# Patient Record
Sex: Female | Born: 1985 | Race: Black or African American | Hispanic: No | Marital: Single | Smoking: Current every day smoker
Health system: Southern US, Community
[De-identification: ages and names within clinical notes are randomized; demographics above are authoritative.]

## PROBLEM LIST (undated history)

## (undated) ENCOUNTER — Inpatient Hospital Stay (HOSPITAL_COMMUNITY): Payer: Self-pay

## (undated) DIAGNOSIS — D573 Sickle-cell trait: Secondary | ICD-10-CM

## (undated) DIAGNOSIS — D352 Benign neoplasm of pituitary gland: Secondary | ICD-10-CM

## (undated) HISTORY — DX: Sickle-cell trait: D57.3

---

## 2013-11-12 ENCOUNTER — Encounter (HOSPITAL_COMMUNITY): Payer: Self-pay | Admitting: Emergency Medicine

## 2013-11-12 ENCOUNTER — Emergency Department (HOSPITAL_COMMUNITY)
Admission: EM | Admit: 2013-11-12 | Discharge: 2013-11-12 | Disposition: A | Discharge status: Home / Self Care | Payer: Medicaid Other | Attending: Emergency Medicine | Admitting: Emergency Medicine

## 2013-11-12 DIAGNOSIS — A499 Bacterial infection, unspecified: Secondary | ICD-10-CM | POA: Insufficient documentation

## 2013-11-12 DIAGNOSIS — Z8639 Personal history of other endocrine, nutritional and metabolic disease: Secondary | ICD-10-CM | POA: Insufficient documentation

## 2013-11-12 DIAGNOSIS — K5900 Constipation, unspecified: Secondary | ICD-10-CM | POA: Diagnosis not present

## 2013-11-12 DIAGNOSIS — O9989 Other specified diseases and conditions complicating pregnancy, childbirth and the puerperium: Secondary | ICD-10-CM | POA: Diagnosis not present

## 2013-11-12 DIAGNOSIS — O99330 Smoking (tobacco) complicating pregnancy, unspecified trimester: Secondary | ICD-10-CM | POA: Insufficient documentation

## 2013-11-12 DIAGNOSIS — B9689 Other specified bacterial agents as the cause of diseases classified elsewhere: Secondary | ICD-10-CM | POA: Diagnosis not present

## 2013-11-12 DIAGNOSIS — Z862 Personal history of diseases of the blood and blood-forming organs and certain disorders involving the immune mechanism: Secondary | ICD-10-CM | POA: Diagnosis not present

## 2013-11-12 DIAGNOSIS — N760 Acute vaginitis: Secondary | ICD-10-CM | POA: Insufficient documentation

## 2013-11-12 DIAGNOSIS — R350 Frequency of micturition: Secondary | ICD-10-CM | POA: Diagnosis not present

## 2013-11-12 DIAGNOSIS — O2390 Unspecified genitourinary tract infection in pregnancy, unspecified trimester: Secondary | ICD-10-CM | POA: Diagnosis present

## 2013-11-12 DIAGNOSIS — Z3490 Encounter for supervision of normal pregnancy, unspecified, unspecified trimester: Secondary | ICD-10-CM

## 2013-11-12 HISTORY — DX: Benign neoplasm of pituitary gland: D35.2

## 2013-11-12 LAB — URINALYSIS, ROUTINE W REFLEX MICROSCOPIC
Bilirubin Urine: NEGATIVE
GLUCOSE, UA: NEGATIVE mg/dL
Hgb urine dipstick: NEGATIVE
KETONES UR: NEGATIVE mg/dL
Leukocytes, UA: NEGATIVE
NITRITE: NEGATIVE
PH: 5.5 (ref 5.0–8.0)
Protein, ur: NEGATIVE mg/dL
Specific Gravity, Urine: 1.013 (ref 1.005–1.030)
Urobilinogen, UA: 0.2 mg/dL (ref 0.0–1.0)

## 2013-11-12 LAB — WET PREP, GENITAL
TRICH WET PREP: NONE SEEN
WBC, Wet Prep HPF POC: NONE SEEN
YEAST WET PREP: NONE SEEN

## 2013-11-12 LAB — POC URINE PREG, ED: PREG TEST UR: POSITIVE — AB

## 2013-11-12 MED ORDER — METRONIDAZOLE 500 MG PO TABS: 500.0000 mg | ORAL_TABLET | Freq: Two times a day (BID) | ORAL | Status: DC

## 2013-11-12 MED ORDER — METRONIDAZOLE 500 MG PO TABS
500.0000 mg | ORAL_TABLET | Freq: Once | ORAL | Status: AC
  Administered 2013-11-12: 500 mg via ORAL
  Filled 2013-11-12: qty 1

## 2013-11-12 NOTE — Discharge Instructions (Signed)
Call the referral number to set an appointment Tell them your are referred through the ED and are high risk

## 2013-11-12 NOTE — ED Notes (Signed)
Pelvic cart set up at bedside  

## 2013-11-12 NOTE — ED Notes (Addendum)
Presents with urinary frequency for one month, dneis pain with urination, reports "it feels like I can't fully empty my bladder" also c/o white discharge when bearing down to use the bathroom. LMP: June 1st-I am late on my period. Last BM-11/07/13. Reports bloating and abdominal cramps.

## 2013-11-12 NOTE — ED Provider Notes (Signed)
CSN: [id number]     Arrival date & time 11/12/13  1858 History   First MD Initiated Contact with Patient 11/12/13 2110     Chief Complaint  Patient presents with  . Urinary Frequency  . Constipation     (Consider location/radiation/quality/duration/timing/severity/associated sxs/prior Treatment) HPI Comments: Patient with a history of urinary frequency for the past, month, and chronic constipation.  States she has not had a good bowel movement in the past 9 days.  When she forces herself to have a bowel movement.  A small amount and she desire feel as, though she is relieved herself completely.  Also, reports a last menstrual period was June 1 2 has not done.  Pregnancy test, as she has not gotten pregnant in the past 6 years.  Despite the fact of no birth control.  She has a history of, to deteriorate, adenoma.  He has no history of STDs or GYN issues. Denies any vaginal discharge, but would like a gynecological test to make, sure she does not have BV or yeast infection. She moved to the area, approximately one week ago.  Does not have a local OB/GYN  Patient is a 28 y.o. female presenting with frequency and constipation. The history is provided by the patient.  Urinary Frequency This is a new problem. The current episode started 1 to 4 weeks ago. The problem occurs intermittently. The problem has been unchanged. Pertinent negatives include no abdominal pain, chest pain, diaphoresis, headaches, nausea, rash or vomiting. Nothing aggravates the symptoms. She has tried nothing for the symptoms. The treatment provided no relief.  Constipation Associated symptoms: no abdominal pain, no diarrhea, no dysuria, no nausea and no vomiting     Past Medical History  Diagnosis Date  . Pituitary adenoma    Past Surgical History  Procedure Laterality Date  . Cesarean section     History reviewed. No pertinent family history. History  Substance Use Topics  . Smoking status: Current Every Day Smoker     Types: Cigarettes  . Smokeless tobacco: Not on file  . Alcohol Use: No   OB History   Grav Para Term Preterm Abortions TAB SAB Ect Mult Living                 Review of Systems  Constitutional: Negative for diaphoresis.  Cardiovascular: Negative for chest pain.  Gastrointestinal: Positive for constipation and abdominal distention. Negative for nausea, vomiting, abdominal pain and diarrhea.  Genitourinary: Positive for frequency and vaginal discharge. Negative for dysuria, urgency, decreased urine volume, vaginal bleeding and vaginal pain.  Skin: Negative for rash.  Neurological: Negative for dizziness and headaches.  All other systems reviewed and are negative.     Allergies  Vicodin  Home Medications   Prior to Admission medications   Medication Sig Start Date End Date Taking? Authorizing Provider  metroNIDAZOLE (FLAGYL) 500 MG tablet Take 1 tablet (500 mg total) by mouth 2 (two) times daily. 11/12/13   Garald Balding, NP   BP 103/70  Pulse 63  Temp(Src) 98.7 F (37.1 C) (Oral)  Resp 20  Ht 5\' 2"  (1.575 m)  Wt 165 lb (74.844 kg)  BMI 30.17 kg/m2  SpO2 98%  LMP 09/30/2013 Physical Exam  Nursing note and vitals reviewed. Constitutional: She appears well-developed and well-nourished.  HENT:  Head: Normocephalic.  Eyes: Pupils are equal, round, and reactive to light.  Neck: Normal range of motion.  Pulmonary/Chest: Effort normal.  Genitourinary: Uterus normal. Cervix exhibits no discharge. Vaginal discharge found.  Musculoskeletal: Normal range of motion.  Neurological: She is alert.  Skin: Skin is warm and dry.    ED Course  Procedures (including critical care time) Labs Review Labs Reviewed  WET PREP, GENITAL - Abnormal; Notable for the following:    Clue Cells Wet Prep HPF POC MANY (*)    All other components within normal limits  POC URINE PREG, ED - Abnormal; Notable for the following:    Preg Test, Ur POSITIVE (*)    All other components within  normal limits  GC/CHLAMYDIA PROBE AMP  URINALYSIS, ROUTINE W REFLEX MICROSCOPIC    Imaging Review No results found.   EKG Interpretation None      MDM  Is positive for BV should be started on metronidazole 500 mg twice a day for 7 days.  She's also been referred to [facility] hospital.  She is to call first in the morning telling them that she is high-risk and referred to the emergency department Final diagnoses:  Pregnancy  BV (bacterial vaginosis)        Garald Balding, NP 11/12/13 2242

## 2013-11-13 LAB — GC/CHLAMYDIA PROBE AMP
CT Probe RNA: NEGATIVE
GC PROBE AMP APTIMA: NEGATIVE

## 2013-11-13 NOTE — ED Provider Notes (Signed)
Medical screening examination/treatment/procedure(s) were performed by non-physician practitioner and as supervising physician I was immediately available for consultation/collaboration.   EKG Interpretation None       Varney Biles, MD 11/13/13 1210

## 2013-11-27 ENCOUNTER — Encounter (HOSPITAL_COMMUNITY): Payer: Self-pay | Admitting: *Deleted

## 2013-11-27 ENCOUNTER — Inpatient Hospital Stay (HOSPITAL_COMMUNITY): Payer: Medicaid Other

## 2013-11-27 ENCOUNTER — Inpatient Hospital Stay (HOSPITAL_COMMUNITY)
Admission: AD | Admit: 2013-11-27 | Discharge: 2013-11-27 | Disposition: A | Discharge status: Home / Self Care | Payer: Medicaid Other | Source: Ambulatory Visit | Attending: Obstetrics & Gynecology | Admitting: Obstetrics & Gynecology

## 2013-11-27 DIAGNOSIS — O99891 Other specified diseases and conditions complicating pregnancy: Secondary | ICD-10-CM | POA: Diagnosis not present

## 2013-11-27 DIAGNOSIS — O99330 Smoking (tobacco) complicating pregnancy, unspecified trimester: Secondary | ICD-10-CM | POA: Diagnosis not present

## 2013-11-27 DIAGNOSIS — R109 Unspecified abdominal pain: Secondary | ICD-10-CM | POA: Insufficient documentation

## 2013-11-27 DIAGNOSIS — O9989 Other specified diseases and conditions complicating pregnancy, childbirth and the puerperium: Secondary | ICD-10-CM

## 2013-11-27 DIAGNOSIS — O26899 Other specified pregnancy related conditions, unspecified trimester: Secondary | ICD-10-CM

## 2013-11-27 LAB — URINALYSIS, ROUTINE W REFLEX MICROSCOPIC
Bilirubin Urine: NEGATIVE
Glucose, UA: NEGATIVE mg/dL
HGB URINE DIPSTICK: NEGATIVE
KETONES UR: NEGATIVE mg/dL
Leukocytes, UA: NEGATIVE
NITRITE: NEGATIVE
PH: 7.5 (ref 5.0–8.0)
Protein, ur: NEGATIVE mg/dL
SPECIFIC GRAVITY, URINE: 1.01 (ref 1.005–1.030)
Urobilinogen, UA: 0.2 mg/dL (ref 0.0–1.0)

## 2013-11-27 LAB — CBC WITH DIFFERENTIAL/PLATELET
Basophils Absolute: 0 10*3/uL (ref 0.0–0.1)
Basophils Relative: 0 % (ref 0–1)
Eosinophils Absolute: 0.2 10*3/uL (ref 0.0–0.7)
Eosinophils Relative: 3 % (ref 0–5)
HEMATOCRIT: 39.1 % (ref 36.0–46.0)
HEMOGLOBIN: 13.6 g/dL (ref 12.0–15.0)
LYMPHS ABS: 1.9 10*3/uL (ref 0.7–4.0)
Lymphocytes Relative: 28 % (ref 12–46)
MCH: 30.3 pg (ref 26.0–34.0)
MCHC: 34.8 g/dL (ref 30.0–36.0)
MCV: 87.1 fL (ref 78.0–100.0)
MONO ABS: 0.8 10*3/uL (ref 0.1–1.0)
Monocytes Relative: 12 % (ref 3–12)
NEUTROS PCT: 57 % (ref 43–77)
Neutro Abs: 3.9 10*3/uL (ref 1.7–7.7)
Platelets: 188 10*3/uL (ref 150–400)
RBC: 4.49 MIL/uL (ref 3.87–5.11)
RDW: 13 % (ref 11.5–15.5)
WBC: 6.8 10*3/uL (ref 4.0–10.5)

## 2013-11-27 LAB — HCG, QUANTITATIVE, PREGNANCY: hCG, Beta Chain, Quant, S: 83474 m[IU]/mL — ABNORMAL HIGH (ref <5)

## 2013-11-27 NOTE — MAU Note (Signed)
Cramping and pulling pain, bilateral- started last wk. Hx of incompetent cervix, stillborn at 68wks.

## 2013-11-27 NOTE — Discharge Instructions (Signed)
Abdominal Pain During Pregnancy °Belly (abdominal) pain is common during pregnancy. Most of the time, it is not a serious problem. Other times, it can be a sign that something is wrong with the pregnancy. Always tell your doctor if you have belly pain. °HOME CARE °Monitor your belly pain for any changes. The following actions may help you feel better: °· Do not have sex (intercourse) or put anything in your vagina until you feel better. °· Rest until your pain stops. °· Drink clear fluids if you feel sick to your stomach (nauseous). Do not eat solid food until you feel better. °· Only take medicine as told by your doctor. °· Keep all doctor visits as told. °GET HELP RIGHT AWAY IF:  °· You are bleeding, leaking fluid, or pieces of tissue come out of your vagina. °· You have more pain or cramping. °· You keep throwing up (vomiting). °· You have pain when you pee (urinate) or have blood in your pee. °· You have a fever. °· You do not feel your baby moving as much. °· You feel very weak or feel like passing out. °· You have trouble breathing, with or without belly pain. °· You have a very bad headache and belly pain. °· You have fluid leaking from your vagina and belly pain. °· You keep having watery poop (diarrhea). °· Your belly pain does not go away after resting, or the pain gets worse. °MAKE SURE YOU:  °· Understand these instructions. °· Will watch your condition. °· Will get help right away if you are not doing well or get worse. °Document Released: 04/06/2009 Document Revised: 12/19/2012 Document Reviewed: 11/15/2012 °[employer]® Patient Information ©2015 [employer]. This information is not intended to replace advice given to you by your health care provider. Make sure you discuss any questions you have with your health care provider. ° °

## 2013-11-27 NOTE — MAU Provider Note (Signed)
History     CSN: [id number]  Arrival date and time: 11/27/13 1432   First Provider Initiated Contact with Patient 11/27/13 1521      Chief Complaint  Patient presents with  . Abdominal Pain   HPI Evelyn Thornton is a 28 y.o. [phone] at [redacted]w[redacted]d who presents to MAU today with complaint of abdominal pain x 1 week. The patient states that the pain is in the lower abdomen bilaterally. She feels it is getting worse over the last few days. She describes the pain as pulling. She denies vaginal bleeding, discharge, fever, N/V or UTI symptoms. She has an appointment to start prenatal care with [facility] on 12/09/13.   OB History   Grav Para Term Preterm Abortions TAB SAB Ect Mult Living   4 2 2  1  1   2       Past Medical History  Diagnosis Date  . Pituitary adenoma     Past Surgical History  Procedure Laterality Date  . Cesarean section      Family History  Problem Relation Age of Onset  . Hypertension Mother   . Hypertension Maternal Grandmother   . Hypertension Paternal Grandmother   . Diabetes Maternal Grandfather     History  Substance Use Topics  . Smoking status: Current Every Day Smoker    Types: Cigarettes  . Smokeless tobacco: Not on file  . Alcohol Use: No    Allergies:  Allergies  Allergen Reactions  . Vicodin [Hydrocodone-Acetaminophen] Anaphylaxis    No prescriptions prior to admission    Review of Systems  Constitutional: Negative for fever and malaise/fatigue.  Gastrointestinal: Positive for abdominal pain. Negative for nausea, vomiting, diarrhea and constipation.  Genitourinary: Negative for dysuria, urgency and frequency.       Neg - vaginal bleeding, discharge   Physical Exam   Blood pressure 101/57, pulse 78, temperature 98.5 F (36.9 C), temperature source Oral, resp. rate 16, height 5' 1.5" (1.562 m), weight 171 lb (77.565 kg), last menstrual period 09/30/2013.  Physical Exam  Constitutional: She is oriented to person, place, and time. She  appears well-developed and well-nourished. No distress.  HENT:  Head: Normocephalic.  Cardiovascular: Normal rate.   Respiratory: Effort normal.  GI: Soft. She exhibits no distension and no mass. There is tenderness (midline lower abdominal tenderness to palpation). There is no rebound and no guarding.  Neurological: She is alert and oriented to person, place, and time.  Skin: Skin is warm and dry. No erythema.  Psychiatric: She has a normal mood and affect.   Results for orders placed during the hospital encounter of 11/27/13 (from the past 24 hour(s))  URINALYSIS, ROUTINE W REFLEX MICROSCOPIC     Status: None   Collection Time    11/27/13  3:35 PM      Result Value Ref Range   Color, Urine YELLOW  YELLOW   APPearance CLEAR  CLEAR   Specific Gravity, Urine 1.010  1.005 - 1.030   pH 7.5  5.0 - 8.0   Glucose, UA NEGATIVE  NEGATIVE mg/dL   Hgb urine dipstick NEGATIVE  NEGATIVE   Bilirubin Urine NEGATIVE  NEGATIVE   Ketones, ur NEGATIVE  NEGATIVE mg/dL   Protein, ur NEGATIVE  NEGATIVE mg/dL   Urobilinogen, UA 0.2  0.0 - 1.0 mg/dL   Nitrite NEGATIVE  NEGATIVE   Leukocytes, UA NEGATIVE  NEGATIVE  CBC WITH DIFFERENTIAL     Status: None   Collection Time    11/27/13  3:37 PM  Result Value Ref Range   WBC 6.8  4.0 - 10.5 K/uL   RBC 4.49  3.87 - 5.11 MIL/uL   Hemoglobin 13.6  12.0 - 15.0 g/dL   HCT 39.1  36.0 - 46.0 %   MCV 87.1  78.0 - 100.0 fL   MCH 30.3  26.0 - 34.0 pg   MCHC 34.8  30.0 - 36.0 g/dL   RDW 13.0  11.5 - 15.5 %   Platelets 188  150 - 400 K/uL   Neutrophils Relative % 57  43 - 77 %   Neutro Abs 3.9  1.7 - 7.7 K/uL   Lymphocytes Relative 28  12 - 46 %   Lymphs Abs 1.9  0.7 - 4.0 K/uL   Monocytes Relative 12  3 - 12 %   Monocytes Absolute 0.8  0.1 - 1.0 K/uL   Eosinophils Relative 3  0 - 5 %   Eosinophils Absolute 0.2  0.0 - 0.7 K/uL   Basophils Relative 0  0 - 1 %   Basophils Absolute 0.0  0.0 - 0.1 K/uL  HCG, QUANTITATIVE, PREGNANCY     Status: Abnormal    Collection Time    11/27/13  3:37 PM      Result Value Ref Range   hCG, Beta Chain, Quant, S [id number] (*) <5 mIU/mL   US Ob Comp Less 14 Wks  11/27/2013   CLINICAL DATA:  Pelvic pain.  EXAM: OBSTETRIC <14 WK [place] AND TRANSVAGINAL OB US  TECHNIQUE: Both transabdominal and transvaginal ultrasound examinations were performed for complete evaluation of the gestation as well as the maternal uterus, adnexal regions, and pelvic cul-de-sac. Transvaginal technique was performed to assess early pregnancy.  COMPARISON:  None.  FINDINGS: Intrauterine gestational sac: Visualized/normal in shape.  Yolk sac:  Visualized.  Embryo:  Visualized.  Cardiac Activity: Visualized.  Heart Rate:  154 bpm  CRL:   14.8  mm   7 w 6 d                  [place] EDC: July 10, 2014.  Maternal uterus/adnexae: Probable corpus luteum cyst seen in right ovary. Left ovary appears normal. No hemorrhage is noted.  IMPRESSION: Single live intrauterine gestation of 7 weeks 6 days.   Electronically Signed   By: Sabino Dick M.D.   On: 11/27/2013 16:23   US Ob Transvaginal  11/27/2013   CLINICAL DATA:  Pelvic pain.  EXAM: OBSTETRIC <14 WK [place] AND TRANSVAGINAL OB US  TECHNIQUE: Both transabdominal and transvaginal ultrasound examinations were performed for complete evaluation of the gestation as well as the maternal uterus, adnexal regions, and pelvic cul-de-sac. Transvaginal technique was performed to assess early pregnancy.  COMPARISON:  None.  FINDINGS: Intrauterine gestational sac: Visualized/normal in shape.  Yolk sac:  Visualized.  Embryo:  Visualized.  Cardiac Activity: Visualized.  Heart Rate:  154 bpm  CRL:   14.8  mm   7 w 6 d                  [place] EDC: July 10, 2014.  Maternal uterus/adnexae: Probable corpus luteum cyst seen in right ovary. Left ovary appears normal. No hemorrhage is noted.  IMPRESSION: Single live intrauterine gestation of 7 weeks 6 days.   Electronically Signed   By: Sabino Dick M.D.   On: 11/27/2013 16:23    MAU Course   Procedures None  MDM +UPT @ [facility] a few weeks ago UA, CBC, ABO/Rh, quant hCG and [place] today Pelvic  exam deferred as patient was just recently examined and treated within the last 2 weeks  Assessment and Plan  A: [facility] at [redacted]w[redacted]d Abdominal pain in pregnancy  P: Discharge home Patient advised to use Tylenol PRN pain First trimester warning signs discussed Patient advised to keep scheduled appointment to start prenatal care with [facility] Patient may return to MAU as needed or if her condition were to change or worsen   Farris Has, PA-C  11/27/2013, 7:22 PM

## 2013-11-27 NOTE — MAU Note (Signed)
Was on fertility medicine on April and May (MD in [place])

## 2013-11-27 NOTE — MAU Note (Signed)
Pt states she found out she was pregnant about 10days ago.pt states she was told that she had" ovarian failure"  In May. Pt states she took fertility medication in April and May. Pt states she feels like her last period June 1,2015. Which was light and pt states she was have spotting.

## 2013-11-28 NOTE — MAU Provider Note (Signed)
Attestation of Attending Supervision of Advanced Practitioner (PA/CNM/NP): Evaluation and management procedures were performed by the Advanced Practitioner under my supervision and collaboration.  I have reviewed the Advanced Practitioner's note and chart, and I agree with the management and plan.  Katara Griner, MD, FACOG Attending Obstetrician & Gynecologist Faculty Practice, [facility]   

## 2013-12-09 ENCOUNTER — Encounter: Payer: Self-pay | Admitting: Family Medicine

## 2013-12-09 ENCOUNTER — Other Ambulatory Visit (HOSPITAL_COMMUNITY)
Admission: RE | Admit: 2013-12-09 | Discharge: 2013-12-09 | Disposition: A | Discharge status: Home / Self Care | Payer: Medicaid Other | Source: Ambulatory Visit | Attending: Family Medicine | Admitting: Family Medicine

## 2013-12-09 ENCOUNTER — Ambulatory Visit (INDEPENDENT_AMBULATORY_CARE_PROVIDER_SITE_OTHER): Payer: Medicaid Other | Admitting: Family Medicine

## 2013-12-09 VITALS — BP 112/74 | HR 91 | Temp 98.4°F | Ht 62.0 in | Wt 168.5 lb

## 2013-12-09 DIAGNOSIS — O0990 Supervision of high risk pregnancy, unspecified, unspecified trimester: Secondary | ICD-10-CM | POA: Insufficient documentation

## 2013-12-09 DIAGNOSIS — Z01419 Encounter for gynecological examination (general) (routine) without abnormal findings: Secondary | ICD-10-CM | POA: Insufficient documentation

## 2013-12-09 DIAGNOSIS — O09299 Supervision of pregnancy with other poor reproductive or obstetric history, unspecified trimester: Secondary | ICD-10-CM

## 2013-12-09 DIAGNOSIS — O3680X0 Pregnancy with inconclusive fetal viability, not applicable or unspecified: Secondary | ICD-10-CM

## 2013-12-09 DIAGNOSIS — Z3480 Encounter for supervision of other normal pregnancy, unspecified trimester: Secondary | ICD-10-CM

## 2013-12-09 DIAGNOSIS — O34219 Maternal care for unspecified type scar from previous cesarean delivery: Secondary | ICD-10-CM | POA: Insufficient documentation

## 2013-12-09 DIAGNOSIS — O99330 Smoking (tobacco) complicating pregnancy, unspecified trimester: Secondary | ICD-10-CM

## 2013-12-09 DIAGNOSIS — D353 Benign neoplasm of craniopharyngeal duct: Secondary | ICD-10-CM

## 2013-12-09 DIAGNOSIS — O09291 Supervision of pregnancy with other poor reproductive or obstetric history, first trimester: Secondary | ICD-10-CM

## 2013-12-09 DIAGNOSIS — Z3481 Encounter for supervision of other normal pregnancy, first trimester: Secondary | ICD-10-CM

## 2013-12-09 DIAGNOSIS — O3680X1 Pregnancy with inconclusive fetal viability, fetus 1: Secondary | ICD-10-CM

## 2013-12-09 DIAGNOSIS — D352 Benign neoplasm of pituitary gland: Secondary | ICD-10-CM | POA: Insufficient documentation

## 2013-12-09 LAB — POCT URINALYSIS DIP (DEVICE)
BILIRUBIN URINE: NEGATIVE
GLUCOSE, UA: NEGATIVE mg/dL
HGB URINE DIPSTICK: NEGATIVE
Ketones, ur: NEGATIVE mg/dL
Leukocytes, UA: NEGATIVE
Nitrite: NEGATIVE
PH: 7 (ref 5.0–8.0)
Protein, ur: NEGATIVE mg/dL
SPECIFIC GRAVITY, URINE: 1.01 (ref 1.005–1.030)
Urobilinogen, UA: 0.2 mg/dL (ref 0.0–1.0)

## 2013-12-09 LAB — US OB COMP LESS 14 WKS

## 2013-12-09 NOTE — Patient Instructions (Addendum)
First Trimester of Pregnancy The first trimester of pregnancy is from week 1 until the end of week 12 (months 1 through 3). A week after a sperm fertilizes an egg, the egg will implant on the wall of the uterus. This embryo will begin to develop into a baby. Genes from you and your partner are forming the baby. The female genes determine whether the baby is a boy or a girl. At 6-8 weeks, the eyes and face are formed, and the heartbeat can be seen on ultrasound. At the end of 12 weeks, all the baby's organs are formed.  Now that you are pregnant, you will want to do everything you can to have a healthy baby. Two of the most important things are to get good prenatal care and to follow your health care provider's instructions. Prenatal care is all the medical care you receive before the baby's birth. This care will help prevent, find, and treat any problems during the pregnancy and childbirth. BODY CHANGES Your body goes through many changes during pregnancy. The changes vary from woman to woman.   You may gain or lose a couple of pounds at first.  You may feel sick to your stomach (nauseous) and throw up (vomit). If the vomiting is uncontrollable, call your health care provider.  You may tire easily.  You may develop headaches that can be relieved by medicines approved by your health care provider.  You may urinate more often. Painful urination may mean you have a bladder infection.  You may develop heartburn as a result of your pregnancy.  You may develop constipation because certain hormones are causing the muscles that push waste through your intestines to slow down.  You may develop hemorrhoids or swollen, bulging veins (varicose veins).  Your breasts may begin to grow larger and become tender. Your nipples may stick out more, and the tissue that surrounds them (areola) may become darker.  Your gums may bleed and may be sensitive to brushing and flossing.  Dark spots or blotches  (chloasma, mask of pregnancy) may develop on your face. This will likely fade after the baby is born.  Your menstrual periods will stop.  You may have a loss of appetite.  You may develop cravings for certain kinds of food.  You may have changes in your emotions from day to day, such as being excited to be pregnant or being concerned that something may go wrong with the pregnancy and baby.  You may have more vivid and strange dreams.  You may have changes in your hair. These can include thickening of your hair, rapid growth, and changes in texture. Some women also have hair loss during or after pregnancy, or hair that feels dry or thin. Your hair will most likely return to normal after your baby is born. WHAT TO EXPECT AT YOUR PRENATAL VISITS During a routine prenatal visit:  You will be weighed to make sure you and the baby are growing normally.  Your blood pressure will be taken.  Your abdomen will be measured to track your baby's growth.  The fetal heartbeat will be listened to starting around week 10 or 12 of your pregnancy.  Test results from any previous visits will be discussed. Your health care provider may ask you:  How you are feeling.  If you are feeling the baby move.  If you have had any abnormal symptoms, such as leaking fluid, bleeding, severe headaches, or abdominal cramping.  If you have any questions. Other tests  that may be performed during your first trimester include:  Blood tests to find your blood type and to check for the presence of any previous infections. They will also be used to check for low iron levels (anemia) and Rh antibodies. Later in the pregnancy, blood tests for diabetes will be done along with other tests if problems develop.  Urine tests to check for infections, diabetes, or protein in the urine.  An ultrasound to confirm the proper growth and development of the baby.  An amniocentesis to check for possible genetic problems.  Fetal  screens for spina bifida and Down syndrome.  You may need other tests to make sure you and the baby are doing well. HOME CARE INSTRUCTIONS  Medicines  Follow your health care provider's instructions regarding medicine use. Specific medicines may be either safe or unsafe to take during pregnancy.  Take your prenatal vitamins as directed.  If you develop constipation, try taking a stool softener if your health care provider approves. Diet  Eat regular, well-balanced meals. Choose a variety of foods, such as meat or vegetable-based protein, fish, milk and low-fat dairy products, vegetables, fruits, and whole grain breads and cereals. Your health care provider will help you determine the amount of weight gain that is right for you.  Avoid raw meat and uncooked cheese. These carry germs that can cause birth defects in the baby.  Eating four or five small meals rather than three large meals a day may help relieve nausea and vomiting. If you start to feel nauseous, eating a few soda crackers can be helpful. Drinking liquids between meals instead of during meals also seems to help nausea and vomiting.  If you develop constipation, eat more high-fiber foods, such as fresh vegetables or fruit and whole grains. Drink enough fluids to keep your urine clear or pale yellow. Activity and Exercise  Exercise only as directed by your health care provider. Exercising will help you:  Control your weight.  Stay in shape.  Be prepared for labor and delivery.  Experiencing pain or cramping in the lower abdomen or low back is a good sign that you should stop exercising. Check with your health care provider before continuing normal exercises.  Try to avoid standing for long periods of time. Move your legs often if you must stand in one place for a long time.  Avoid heavy lifting.  Wear low-heeled shoes, and practice good posture.  You may continue to have sex unless your health care provider directs you  otherwise. Relief of Pain or Discomfort  Wear a good support bra for breast tenderness.   Take warm sitz baths to soothe any pain or discomfort caused by hemorrhoids. Use hemorrhoid cream if your health care provider approves.   Rest with your legs elevated if you have leg cramps or low back pain.  If you develop varicose veins in your legs, wear support hose. Elevate your feet for 15 minutes, 3-4 times a day. Limit salt in your diet. Prenatal Care  Schedule your prenatal visits by the twelfth week of pregnancy. They are usually scheduled monthly at first, then more often in the last 2 months before delivery.  Write down your questions. Take them to your prenatal visits.  Keep all your prenatal visits as directed by your health care provider. Safety  Wear your seat belt at all times when driving.  Make a list of emergency phone numbers, including numbers for family, friends, the hospital, and police and fire departments. General Tips  Ask your health care provider for a referral to a local prenatal education class. Begin classes no later than at the beginning of month 6 of your pregnancy.  Ask for help if you have counseling or nutritional needs during pregnancy. Your health care provider can offer advice or refer you to specialists for help with various needs.  Do not use hot tubs, steam rooms, or saunas.  Do not douche or use tampons or scented sanitary pads.  Do not cross your legs for long periods of time.  Avoid cat litter boxes and soil used by cats. These carry germs that can cause birth defects in the baby and possibly loss of the fetus by miscarriage or stillbirth.  Avoid all smoking, herbs, alcohol, and medicines not prescribed by your health care provider. Chemicals in these affect the formation and growth of the baby.  Schedule a dentist appointment. At home, brush your teeth with a soft toothbrush and be gentle when you floss. SEEK MEDICAL CARE IF:   You have  dizziness.  You have mild pelvic cramps, pelvic pressure, or nagging pain in the abdominal area.  You have persistent nausea, vomiting, or diarrhea.  You have a bad smelling vaginal discharge.  You have pain with urination.  You notice increased swelling in your face, hands, legs, or ankles. SEEK IMMEDIATE MEDICAL CARE IF:   You have a fever.  You are leaking fluid from your vagina.  You have spotting or bleeding from your vagina.  You have severe abdominal cramping or pain.  You have rapid weight gain or loss.  You vomit blood or material that looks like coffee grounds.  You are exposed to [place] measles and have never had them.  You are exposed to fifth disease or chickenpox.  You develop a severe headache.  You have shortness of breath.  You have any kind of trauma, such as from a fall or a car accident. Document Released: 04/12/2001 Document Revised: 09/02/2013 Document Reviewed: 02/26/2013 [facility] Patient Information 2015 [place], [place]. This information is not intended to replace advice given to you by your health care provider. Make sure you discuss any questions you have with your health care provider.  Breastfeeding Deciding to breastfeed is one of the [place] choices you can make for you and your baby. A change in hormones during pregnancy causes your breast tissue to grow and increases the number and size of your milk ducts. These hormones also allow proteins, sugars, and fats from your blood supply to make breast milk in your milk-producing glands. Hormones prevent breast milk from being released before your baby is born as well as prompt milk flow after birth. Once breastfeeding has begun, thoughts of your baby, as well as his or her sucking or crying, can stimulate the release of milk from your milk-producing glands.  BENEFITS OF BREASTFEEDING For Your Baby  Your first milk (colostrum) helps your baby's digestive system function better.   There are antibodies  in your milk that help your baby fight off infections.   Your baby has a lower incidence of asthma, allergies, and sudden infant death syndrome.   The nutrients in breast milk are better for your baby than infant formulas and are designed uniquely for your baby's needs.   Breast milk improves your baby's brain development.   Your baby is less likely to develop other conditions, such as childhood obesity, asthma, or type 2 diabetes mellitus.  For You   Breastfeeding helps to create a very special bond between  you and your baby.   Breastfeeding is convenient. Breast milk is always available at the correct temperature and costs nothing.   Breastfeeding helps to burn calories and helps you lose the weight gained during pregnancy.   Breastfeeding makes your uterus contract to its prepregnancy size faster and slows bleeding (lochia) after you give birth.   Breastfeeding helps to lower your risk of developing type 2 diabetes mellitus, osteoporosis, and breast or ovarian cancer later in life. SIGNS THAT YOUR BABY IS HUNGRY Early Signs of Hunger  Increased alertness or activity.  Stretching.  Movement of the head from side to side.  Movement of the head and opening of the mouth when the corner of the mouth or cheek is stroked (rooting).  Increased sucking sounds, smacking lips, cooing, sighing, or squeaking.  Hand-to-mouth movements.  Increased sucking of fingers or hands. Late Signs of Hunger  Fussing.  Intermittent crying. Extreme Signs of Hunger Signs of extreme hunger will require calming and consoling before your baby will be able to breastfeed successfully. Do not wait for the following signs of extreme hunger to occur before you initiate breastfeeding:   Restlessness.  A loud, strong cry.   Screaming. BREASTFEEDING BASICS Breastfeeding Initiation  Find a comfortable place to sit or lie down, with your neck and back well supported.  Place a pillow or  rolled up blanket under your baby to bring him or her to the level of your breast (if you are seated). Nursing pillows are specially designed to help support your arms and your baby while you breastfeed.  Make sure that your baby's abdomen is facing your abdomen.   Gently massage your breast. With your fingertips, massage from your chest wall toward your nipple in a circular motion. This encourages milk flow. You may need to continue this action during the feeding if your milk flows slowly.  Support your breast with 4 fingers underneath and your thumb above your nipple. Make sure your fingers are well away from your nipple and your baby's mouth.   Stroke your baby's lips gently with your finger or nipple.   When your baby's mouth is open wide enough, quickly bring your baby to your breast, placing your entire nipple and as much of the colored area around your nipple (areola) as possible into your baby's mouth.   More areola should be visible above your baby's upper lip than below the lower lip.   Your baby's tongue should be between his or her lower gum and your breast.   Ensure that your baby's mouth is correctly positioned around your nipple (latched). Your baby's lips should create a seal on your breast and be turned out (everted).  It is common for your baby to suck about 2-3 minutes in order to start the flow of breast milk. Latching Teaching your baby how to latch on to your breast properly is very important. An improper latch can cause nipple pain and decreased milk supply for you and poor weight gain in your baby. Also, if your baby is not latched onto your nipple properly, he or she may swallow some air during feeding. This can make your baby fussy. Burping your baby when you switch breasts during the feeding can help to get rid of the air. However, teaching your baby to latch on properly is still the [place] way to prevent fussiness from swallowing air while breastfeeding. Signs  that your baby has successfully latched on to your nipple:    Silent tugging or silent  sucking, without causing you pain.   Swallowing heard between every 3-4 sucks.    Muscle movement above and in front of his or her ears while sucking.  Signs that your baby has not successfully latched on to nipple:   Sucking sounds or smacking sounds from your baby while breastfeeding.  Nipple pain. If you think your baby has not latched on correctly, slip your finger into the corner of your baby's mouth to break the suction and place it between your baby's gums. Attempt breastfeeding initiation again. Signs of Successful Breastfeeding Signs from your baby:   A gradual decrease in the number of sucks or complete cessation of sucking.   Falling asleep.   Relaxation of his or her body.   Retention of a small amount of milk in his or her mouth.   Letting go of your breast by himself or herself. Signs from you:  Breasts that have increased in firmness, weight, and size 1-3 hours after feeding.   Breasts that are softer immediately after breastfeeding.  Increased milk volume, as well as a change in milk consistency and color by the fifth day of breastfeeding.   Nipples that are not sore, cracked, or bleeding. Signs That Your Randel Books is Getting Enough Milk  Wetting at least 3 diapers in a 24-hour period. The urine should be clear and pale yellow by age 69 days.  At least 3 stools in a 24-hour period by age 69 days. The stool should be soft and yellow.  At least 3 stools in a 24-hour period by age 34 days. The stool should be seedy and yellow.  No loss of weight greater than 10% of birth weight during the first 82 days of age.  Average weight gain of 4-7 ounces (113-198 g) per week after age 55 days.  Consistent daily weight gain by age 81 days, without weight loss after the age of 2 weeks. After a feeding, your baby may spit up a small amount. This is common. BREASTFEEDING FREQUENCY AND  DURATION Frequent feeding will help you make more milk and can prevent sore nipples and breast engorgement. Breastfeed when you feel the need to reduce the fullness of your breasts or when your baby shows signs of hunger. This is called "breastfeeding on demand." Avoid introducing a pacifier to your baby while you are working to establish breastfeeding (the first 4-6 weeks after your baby is born). After this time you may choose to use a pacifier. Research has shown that pacifier use during the first year of a baby's life decreases the risk of sudden infant death syndrome (SIDS). Allow your baby to feed on each breast as long as he or she wants. Breastfeed until your baby is finished feeding. When your baby unlatches or falls asleep while feeding from the first breast, offer the second breast. Because newborns are often sleepy in the first few weeks of life, you may need to awaken your baby to get him or her to feed. Breastfeeding times will vary from baby to baby. However, the following rules can serve as a guide to help you ensure that your baby is properly fed:  Newborns (babies 1 weeks of age or younger) may breastfeed every 1-3 hours.  Newborns should not go longer than 3 hours during the day or 5 hours during the night without breastfeeding.  You should breastfeed your baby a minimum of 8 times in a 24-hour period until you begin to introduce solid foods to your baby at around 6  months of age. BREAST MILK PUMPING Pumping and storing breast milk allows you to ensure that your baby is exclusively fed your breast milk, even at times when you are unable to breastfeed. This is especially important if you are going back to work while you are still breastfeeding or when you are not able to be present during feedings. Your lactation consultant can give you guidelines on how long it is safe to store breast milk.  A breast pump is a machine that allows you to pump milk from your breast into a sterile bottle.  The pumped breast milk can then be stored in a refrigerator or freezer. Some breast pumps are operated by hand, while others use electricity. Ask your lactation consultant which type will work [place] for you. Breast pumps can be purchased, but some hospitals and breastfeeding support groups lease breast pumps on a monthly basis. A lactation consultant can teach you how to hand express breast milk, if you prefer not to use a pump.  CARING FOR YOUR BREASTS WHILE YOU BREASTFEED Nipples can become dry, cracked, and sore while breastfeeding. The following recommendations can help keep your breasts moisturized and healthy:  Avoid using soap on your nipples.   Wear a supportive bra. Although not required, special nursing bras and tank tops are designed to allow access to your breasts for breastfeeding without taking off your entire bra or top. Avoid wearing underwire-style bras or extremely tight bras.  Air dry your nipples for 3-31mnutes after each feeding.   Use only cotton bra pads to absorb leaked breast milk. Leaking of breast milk between feedings is normal.   Use lanolin on your nipples after breastfeeding. Lanolin helps to maintain your skin's normal moisture barrier. If you use pure lanolin, you do not need to wash it off before feeding your baby again. Pure lanolin is not toxic to your baby. You may also hand express a few drops of breast milk and gently massage that milk into your nipples and allow the milk to air dry. In the first few weeks after giving birth, some women experience extremely full breasts (engorgement). Engorgement can make your breasts feel heavy, warm, and tender to the touch. Engorgement peaks within 3-5 days after you give birth. The following recommendations can help ease engorgement:  Completely empty your breasts while breastfeeding or pumping. You may want to start by applying warm, moist heat (in the shower or with warm water-soaked hand towels) just before feeding or  pumping. This increases circulation and helps the milk flow. If your baby does not completely empty your breasts while breastfeeding, pump any extra milk after he or she is finished.  Wear a snug bra (nursing or regular) or tank top for 1-2 days to signal your body to slightly decrease milk production.  Apply ice packs to your breasts, unless this is too uncomfortable for you.  Make sure that your baby is latched on and positioned properly while breastfeeding. If engorgement persists after 48 hours of following these recommendations, contact your health care provider or a lScience writer OVERALL HEALTH CARE RECOMMENDATIONS WHILE BREASTFEEDING  Eat healthy foods. Alternate between meals and snacks, eating 3 of each per day. Because what you eat affects your breast milk, some of the foods may make your baby more irritable than usual. Avoid eating these foods if you are sure that they are negatively affecting your baby.  Drink milk, fruit juice, and water to satisfy your thirst (about 10 glasses a day).   Rest  often, relax, and continue to take your prenatal vitamins to prevent fatigue, stress, and anemia.  Continue breast self-awareness checks.  Avoid chewing and smoking tobacco.  Avoid alcohol and drug use. Some medicines that may be harmful to your baby can pass through breast milk. It is important to ask your health care provider before taking any medicine, including all over-the-counter and prescription medicine as well as vitamin and herbal supplements. It is possible to become pregnant while breastfeeding. If birth control is desired, ask your health care provider about options that will be safe for your baby. SEEK MEDICAL CARE IF:   You feel like you want to stop breastfeeding or have become frustrated with breastfeeding.  You have painful breasts or nipples.  Your nipples are cracked or bleeding.  Your breasts are red, tender, or warm.  You have a swollen area on either  breast.  You have a fever or chills.  You have nausea or vomiting.  You have drainage other than breast milk from your nipples.  Your breasts do not become full before feedings by the fifth day after you give birth.  You feel sad and depressed.  Your baby is too sleepy to eat well.  Your baby is having trouble sleeping.   Your baby is wetting less than 3 diapers in a 24-hour period.  Your baby has less than 3 stools in a 24-hour period.  Your baby's skin or the white part of his or her eyes becomes yellow.   Your baby is not gaining weight by 76 days of age. SEEK IMMEDIATE MEDICAL CARE IF:   Your baby is overly tired (lethargic) and does not want to wake up and feed.  Your baby develops an unexplained fever. Document Released: 04/18/2005 Document Revised: 04/23/2013 Document Reviewed: 10/10/2012 [facility] Patient Information 2015 [place], [place]. This information is not intended to replace advice given to you by your health care provider. Make sure you discuss any questions you have with your health care provider. Pregnancy and Smoking Smoking during pregnancy is unhealthy for you and your developing baby. The addictive drug nicotine, carbon monoxide, and many other poisons are inhaled from a cigarette and carried through your bloodstream to your baby. Cigarette smoke contains more than 2,500 chemicals. It is not known which of these are harmful to a developing baby. However, both nicotine and carbon monoxide play a role in causing health problems in pregnancy. Smoking during pregnancy increases the risk of:  Birth defects in your baby, including heart defects.  Miscarriage and stillbirth.  Birth before 41 completed weeks of pregnancy (premature birth).  Pregnancy outside of the uterus (tubal pregnancy).  Attachment of the placenta over the opening of the uterus (placenta previa).  Detachment of the placenta before the baby's birth (placental abruption).  Breaking of the  bag of waters before labor begins (premature rupture of membranes). HOW DOES SMOKING DURING PREGNANCY AFFECT MY BABY? Before Birth Smoking during pregnancy:  Decreases blood flow and oxygen to your baby.  Increases the heart rate of your baby.  Slows your baby's growth in the uterus (intrauterine growth retardation). After Birth Babies born to women who smoke during pregnancy are more likely to have a low birth weight. They are also at risk for:  Serious health problems, chronic or lifelong disabilities (cerebral palsy, mental retardation, learning problems), and death.  Sudden infant death syndrome (SIDS).  Lung and breathing problems. WHAT RESOURCES ARE AVAILABLE TO HELP ME STOP SMOKING?  Ask your health care provider for help  to stop smoking. The following resources are available:  Counseling.  Psychological treatment.  Acupuncture.  Family intervention.  Hypnosis.  Nicotine supplements have not been studied enough to know if they are safe to use during pregnancy. They should only be considered when all other methods fail, and if used under the close supervision of your health care provider.  Telephone QUIT lines. The national smoking cessation telephone hotline number is 1-800-QUIT NOW. FOR MORE INFORMATION  American Cancer Society: www.cancer.org  American Heart Association: www.heart.[place]: www.cancer.gov  March of Dimes: www.marchofdimes.org Document Released: 08/30/2004 Document Revised: 04/23/2013 Document Reviewed: 03/18/2013 [facility] Patient Information 2015 [place], [place]. This information is not intended to replace advice given to you by your health care provider. Make sure you discuss any questions you have with your health care provider.

## 2013-12-09 NOTE — Progress Notes (Signed)
Subjective:    Evelyn Thornton is a [id number] [redacted]w[redacted]d being seen today for her first obstetrical visit.  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 Pregnancy history fully reviewed.  Patient reports headache.  Filed Vitals:   12/09/13 0908 12/09/13 0911  BP: 112/74   Pulse: 91   Temp: 98.4 F (36.9 C)   Height:  5\' 2"  (1.575 m)  Weight: 168 lb 8 oz (76.431 kg)     HISTORY: OB History  Gravida Para Term Preterm AB SAB TAB Ectopic Multiple Living  4 2 2  1 1    2     # Outcome Date GA Lbr Len/2nd Weight Sex Delivery Anes PTL Lv  4 CUR           3 TRM 01/24/08 [redacted]w[redacted]d  7 lb 10 oz (3.459 kg) F CS        Comments: cerclage, 17P, repeat c/s  2 TRM 09/01/06 [redacted]w[redacted]d  7 lb 6 oz (3.345 kg)  CS        Comments: Induced for postdates, had cerclage and 17P shots. c/s because face up, over 24 hour induction, born in [place], [place]  1 SAB 06/2005             Comments: miscarriaged at 20 wk, found to have incompetent cervix, did cerclage but miscarriages.      Past Medical History  Diagnosis Date  . Pituitary adenoma   . Complication of anesthesia   . Spinal headache   . Sickle cell trait    Past Surgical History  Procedure Laterality Date  . Cesarean section     Family History  Problem Relation Age of Onset  . Hypertension Mother   . Hypertension Maternal Grandmother   . Hypertension Paternal Grandmother   . Diabetes Maternal Grandfather      Exam    Uterus:   10 wk size  Pelvic Exam:    Perineum: Normal Perineum   Vulva: Bartholin's, Urethra, Skene's normal   Vagina:  normal mucosa, curdlike discharge   Cervix: no bleeding following Pap and no lesions   Adnexa: normal adnexa   Bony Pelvis: average  System: Breast:  normal appearance, no masses or tenderness   Skin: normal coloration and turgor, no rashes    Neurologic: oriented   Extremities: normal strength, tone, and  muscle mass   HEENT sclera clear, anicteric   Mouth/Teeth mucous membranes moist, pharynx normal without lesions and dental hygiene good   Neck supple   Cardiovascular: regular rate and rhythm   Respiratory:  appears well, vitals normal, no respiratory distress, acyanotic, normal RR, ear and throat exam is normal, neck free of mass or lymphadenopathy, chest clear, no wheezing, crepitations, rhonchi, normal symmetric air entry   Abdomen: soft, non-tender; bowel sounds normal; no masses,  no organomegaly      Assessment:    Pregnancy: [id number] Patient Active Problem List   Diagnosis Date Noted  . Supervision of high-risk pregnancy 12/09/2013    Priority: High  . Previous cesarean delivery, antepartum condition or complication 26/20/3559    Priority: Medium  . Tobacco use affecting pregnancy, antepartum 12/09/2013    Priority: Medium  . History of incompetent cervix, currently pregnant 12/09/2013    Priority: Medium  . Pituitary adenoma 12/09/2013        Plan:     Initial labs drawn. Prenatal vitamins. Problem list reviewed and updated. Genetic Screening discussed  First Screen: declined. Ultrasound discussed; fetal survey: discussed. Follow up in 4 weeks. Cerclage scheduled.   PRATT,TANYA S 12/09/2013

## 2013-12-09 NOTE — Progress Notes (Signed)
C/o bad headaches this pregnancy. Given new pregnancy information.

## 2013-12-09 NOTE — Progress Notes (Signed)
Bedside US for viability =  Single IUP, FHR - 175 per PW doppler, FM observed.

## 2013-12-10 ENCOUNTER — Encounter (HOSPITAL_COMMUNITY): Payer: Self-pay | Admitting: *Deleted

## 2013-12-10 ENCOUNTER — Encounter: Payer: Self-pay | Admitting: Family Medicine

## 2013-12-10 LAB — OBSTETRIC PANEL
ANTIBODY SCREEN: NEGATIVE
Basophils Absolute: 0 10*3/uL (ref 0.0–0.1)
Basophils Relative: 0 % (ref 0–1)
EOS PCT: 2 % (ref 0–5)
Eosinophils Absolute: 0.1 10*3/uL (ref 0.0–0.7)
HEMATOCRIT: 39.4 % (ref 36.0–46.0)
Hemoglobin: 13.5 g/dL (ref 12.0–15.0)
Hepatitis B Surface Ag: NEGATIVE
LYMPHS ABS: 1.8 10*3/uL (ref 0.7–4.0)
LYMPHS PCT: 26 % (ref 12–46)
MCH: 29.9 pg (ref 26.0–34.0)
MCHC: 34.3 g/dL (ref 30.0–36.0)
MCV: 87.4 fL (ref 78.0–100.0)
Monocytes Absolute: 0.6 10*3/uL (ref 0.1–1.0)
Monocytes Relative: 8 % (ref 3–12)
Neutro Abs: 4.5 10*3/uL (ref 1.7–7.7)
Neutrophils Relative %: 64 % (ref 43–77)
Platelets: 212 10*3/uL (ref 150–400)
RBC: 4.51 MIL/uL (ref 3.87–5.11)
RDW: 13.6 % (ref 11.5–15.5)
RH TYPE: POSITIVE
RUBELLA: 4.3 {index} — AB (ref <0.90)
WBC: 7 10*3/uL (ref 4.0–10.5)

## 2013-12-10 LAB — GLUCOSE TOLERANCE, 1 HOUR (50G) W/O FASTING: Glucose, 1 Hour GTT: 77 mg/dL (ref 70–140)

## 2013-12-10 LAB — CYTOLOGY - PAP

## 2013-12-10 LAB — HIV ANTIBODY (ROUTINE TESTING W REFLEX): HIV: NONREACTIVE

## 2013-12-11 ENCOUNTER — Encounter: Payer: Self-pay | Admitting: Family Medicine

## 2013-12-11 DIAGNOSIS — D573 Sickle-cell trait: Secondary | ICD-10-CM | POA: Insufficient documentation

## 2013-12-11 LAB — HEMOGLOBINOPATHY EVALUATION
HGB A2 QUANT: 3 % (ref 2.2–3.2)
HGB A: 56.1 % — AB (ref 96.8–97.8)
Hemoglobin Other: 0 %
Hgb F Quant: 1.1 % (ref 0.0–2.0)
Hgb S Quant: 39.8 % — ABNORMAL HIGH

## 2013-12-11 LAB — PRESCRIPTION MONITORING PROFILE (19 PANEL)
AMPHETAMINE/METH: NEGATIVE ng/mL
BARBITURATE SCREEN, URINE: NEGATIVE ng/mL
BENZODIAZEPINE SCREEN, URINE: NEGATIVE ng/mL
Buprenorphine, Urine: NEGATIVE ng/mL
Cannabinoid Scrn, Ur: NEGATIVE ng/mL
Carisoprodol, Urine: NEGATIVE ng/mL
Cocaine Metabolites: NEGATIVE ng/mL
Creatinine, Urine: 114.79 mg/dL (ref >20.0)
Fentanyl, Ur: NEGATIVE ng/mL
MDMA URINE: NEGATIVE ng/mL
MEPERIDINE UR: NEGATIVE ng/mL
Methadone Screen, Urine: NEGATIVE ng/mL
Methaqualone: NEGATIVE ng/mL
NITRITES URINE, INITIAL: NEGATIVE ug/mL
Opiate Screen, Urine: NEGATIVE ng/mL
Oxycodone Screen, Ur: NEGATIVE ng/mL
PH URINE, INITIAL: 7.4 pH (ref 4.5–8.9)
PROPOXYPHENE: NEGATIVE ng/mL
Phencyclidine, Ur: NEGATIVE ng/mL
TRAMADOL UR: NEGATIVE ng/mL
Tapentadol, urine: NEGATIVE ng/mL
ZOLPIDEM, URINE: NEGATIVE ng/mL

## 2013-12-13 LAB — CULTURE, OB URINE: Colony Count: 30000

## 2013-12-16 ENCOUNTER — Telehealth: Payer: Self-pay | Admitting: General Practice

## 2013-12-16 DIAGNOSIS — N390 Urinary tract infection, site not specified: Secondary | ICD-10-CM

## 2013-12-16 MED ORDER — SULFAMETHOXAZOLE-TMP DS 800-160 MG PO TABS: 1.0000 | ORAL_TABLET | Freq: Two times a day (BID) | ORAL | Status: DC

## 2013-12-16 NOTE — Telephone Encounter (Signed)
Message copied by Shelly Coss on Mon Dec 16, 2013 10:48 AM ------      Message from: Donnamae Jude      Created: Fri Dec 13, 2013  8:12 AM       Has UTI--please treat with sulfa ds 1 po bid x 7 d # 14 no RF ------

## 2013-12-16 NOTE — Telephone Encounter (Signed)
Called patient and informed her of UTI and antibiotic available for pickup. Patient verbalized understanding. Also informed patient of sickle cell trait. Patient verbalized understanding and states she was already aware and that the FOB has already been tested. Patient had no other questions

## 2013-12-24 ENCOUNTER — Encounter (HOSPITAL_COMMUNITY): Payer: Self-pay | Admitting: Pharmacist

## 2013-12-26 ENCOUNTER — Inpatient Hospital Stay (HOSPITAL_COMMUNITY)
Admission: AD | Admit: 2013-12-26 | Discharge: 2013-12-26 | Disposition: A | Discharge status: Home / Self Care | Payer: Medicaid Other | Source: Ambulatory Visit | Attending: Obstetrics & Gynecology | Admitting: Obstetrics & Gynecology

## 2013-12-26 ENCOUNTER — Encounter (HOSPITAL_COMMUNITY): Payer: Self-pay | Admitting: *Deleted

## 2013-12-26 DIAGNOSIS — O26851 Spotting complicating pregnancy, first trimester: Secondary | ICD-10-CM

## 2013-12-26 DIAGNOSIS — O09291 Supervision of pregnancy with other poor reproductive or obstetric history, first trimester: Secondary | ICD-10-CM

## 2013-12-26 DIAGNOSIS — O26859 Spotting complicating pregnancy, unspecified trimester: Secondary | ICD-10-CM | POA: Diagnosis not present

## 2013-12-26 DIAGNOSIS — O99330 Smoking (tobacco) complicating pregnancy, unspecified trimester: Secondary | ICD-10-CM | POA: Diagnosis not present

## 2013-12-26 DIAGNOSIS — R109 Unspecified abdominal pain: Secondary | ICD-10-CM | POA: Insufficient documentation

## 2013-12-26 DIAGNOSIS — O34219 Maternal care for unspecified type scar from previous cesarean delivery: Secondary | ICD-10-CM

## 2013-12-26 DIAGNOSIS — O0990 Supervision of high risk pregnancy, unspecified, unspecified trimester: Secondary | ICD-10-CM

## 2013-12-26 LAB — URINALYSIS, ROUTINE W REFLEX MICROSCOPIC
BILIRUBIN URINE: NEGATIVE
Glucose, UA: NEGATIVE mg/dL
Hgb urine dipstick: NEGATIVE
KETONES UR: 15 mg/dL — AB
Leukocytes, UA: NEGATIVE
Nitrite: NEGATIVE
PH: 7.5 (ref 5.0–8.0)
Protein, ur: NEGATIVE mg/dL
Specific Gravity, Urine: 1.01 (ref 1.005–1.030)
Urobilinogen, UA: 0.2 mg/dL (ref 0.0–1.0)

## 2013-12-26 NOTE — MAU Note (Signed)
Patient states she has had light spotting from pink to brown since last night. Some mild cramping. Patient is not wearing a pad on arrival to MAU.

## 2013-12-26 NOTE — MAU Provider Note (Signed)
  History     CSN: [id number]  Arrival date and time: 12/26/13 1583   None     Chief Complaint  Patient presents with  . Vaginal Discharge   HPI This is a 28 y.o. female at [redacted]w[redacted]d who presents with c/o pink/brown spotting.  Has some cramping, but not severe.  Cerclage placement is scheduled for tomorrow.  RN Note: Patient states she has had light spotting from pink to brown since last night. Some mild cramping. Patient is not wearing a pad on arrival to MAU.        OB History   Grav Para Term Preterm Abortions TAB SAB Ect Mult Living   4 2 2  1  1   2       Past Medical History  Diagnosis Date  . Pituitary adenoma   . Complication of anesthesia   . Sickle cell trait   . Spinal headache     postop CS    Past Surgical History  Procedure Laterality Date  . Cesarean section      Family History  Problem Relation Age of Onset  . Hypertension Mother   . Hypertension Maternal Grandmother   . Hypertension Paternal Grandmother   . Diabetes Maternal Grandfather     History  Substance Use Topics  . Smoking status: Current Every Day Smoker -- 0.25 packs/day    Types: Cigarettes  . Smokeless tobacco: Never Used  . Alcohol Use: No    Allergies:  Allergies  Allergen Reactions  . Vicodin [Hydrocodone-Acetaminophen] Anaphylaxis    Prescriptions prior to admission  Medication Sig Dispense Refill  . Prenatal Vit-Fe Fumarate-FA (PRENATAL MULTIVITAMIN) TABS tablet Take 1 tablet by mouth daily at 12 noon.        Review of Systems  Constitutional: Negative for fever and chills.  Gastrointestinal: Positive for abdominal pain (cramping). Negative for nausea and vomiting.  Genitourinary: Negative for dysuria.  Neurological: Negative for dizziness.   Physical Exam   Height 5\' 2"  (1.575 m), weight 77.021 kg (169 lb 12.8 oz), last menstrual period 09/30/2013.  Physical Exam  Constitutional: She is oriented to person, place, and time. She appears well-developed and  well-nourished. No distress.  HENT:  Head: Normocephalic.  Cardiovascular: Normal rate.   Respiratory: Effort normal.  GI: Soft.  Genitourinary: Uterus normal. Vaginal discharge (light brown/tan discharge, no active bleeding, cervix long and closed) found.  Musculoskeletal: Normal range of motion.  Neurological: She is alert and oriented to person, place, and time.  Skin: Skin is warm and dry.  Psychiatric: She has a normal mood and affect.   Fetal heart rate 160s  MAU Course  Procedures  MDM   Assessment and Plan  A:  SIUP at [redacted]w[redacted]d       Pink spotting      Good FHR audible       Cervix long and closed, hx incompetent cervix  P:  Discharged home       Reassured       Followup tomorrow for cerclage.  Hansel Feinstein 12/26/2013, 9:05 AM

## 2013-12-26 NOTE — Discharge Instructions (Signed)
Vaginal Bleeding During Pregnancy, First Trimester  A small amount of bleeding (spotting) from the vagina is relatively common in early pregnancy. It usually stops on its own. Various things may cause bleeding or spotting in early pregnancy. Some bleeding may be related to the pregnancy, and some may not. In most cases, the bleeding is normal and is not a problem. However, bleeding can also be a sign of something serious. Be sure to tell your health care provider about any vaginal bleeding right away.  Some possible causes of vaginal bleeding during the first trimester include:  · Infection or inflammation of the cervix.  · Growths (polyps) on the cervix.  · Miscarriage or threatened miscarriage.  · Pregnancy tissue has developed outside of the uterus and in a fallopian tube (tubal pregnancy).  · Tiny cysts have developed in the uterus instead of pregnancy tissue (molar pregnancy).  HOME CARE INSTRUCTIONS   Watch your condition for any changes. The following actions may help to lessen any discomfort you are feeling:  · Follow your health care provider's instructions for limiting your activity. If your health care provider orders bed rest, you may need to stay in bed and only get up to use the bathroom. However, your health care provider may allow you to continue light activity.  · If needed, make plans for someone to help with your regular activities and responsibilities while you are on bed rest.  · Keep track of the number of pads you use each day, how often you change pads, and how soaked (saturated) they are. Write this down.  · Do not use tampons. Do not douche.  · Do not have sexual intercourse or orgasms until approved by your health care provider.  · If you pass any tissue from your vagina, save the tissue so you can show it to your health care provider.  · Only take over-the-counter or prescription medicines as directed by your health care provider.  · Do not take aspirin because it can make you  bleed.  · Keep all follow-up appointments as directed by your health care provider.  SEEK MEDICAL CARE IF:  · You have any vaginal bleeding during any part of your pregnancy.  · You have cramps or labor pains.  · You have a fever, not controlled by medicine.  SEEK IMMEDIATE MEDICAL CARE IF:   · You have severe cramps in your back or belly (abdomen).  · You pass large clots or tissue from your vagina.  · Your bleeding increases.  · You feel light-headed or weak, or you have fainting episodes.  · You have chills.  · You are leaking fluid or have a gush of fluid from your vagina.  · You pass out while having a bowel movement.  MAKE SURE YOU:  · Understand these instructions.  · Will watch your condition.  · Will get help right away if you are not doing well or get worse.  Document Released: 01/26/2005 Document Revised: 04/23/2013 Document Reviewed: 12/24/2012  Exit[employer]® Patient Information ©2015 [employer]. This information is not intended to replace advice given to you by your health care provider. Make sure you discuss any questions you have with your health care provider.

## 2013-12-27 ENCOUNTER — Encounter (HOSPITAL_COMMUNITY): Payer: Medicaid Other | Admitting: Anesthesiology

## 2013-12-27 ENCOUNTER — Encounter (HOSPITAL_COMMUNITY): Payer: Self-pay

## 2013-12-27 ENCOUNTER — Encounter (HOSPITAL_COMMUNITY)
Admission: RE | Disposition: A | Discharge status: Home / Self Care | Payer: Self-pay | Source: Ambulatory Visit | Attending: Obstetrics & Gynecology

## 2013-12-27 ENCOUNTER — Ambulatory Visit (HOSPITAL_COMMUNITY): Payer: Medicaid Other | Admitting: Anesthesiology

## 2013-12-27 ENCOUNTER — Ambulatory Visit (HOSPITAL_COMMUNITY)
Admission: RE | Admit: 2013-12-27 | Discharge: 2013-12-27 | Disposition: A | Discharge status: Home / Self Care | Payer: Medicaid Other | Source: Ambulatory Visit | Attending: Obstetrics & Gynecology | Admitting: Obstetrics & Gynecology

## 2013-12-27 DIAGNOSIS — R51 Headache: Secondary | ICD-10-CM | POA: Insufficient documentation

## 2013-12-27 DIAGNOSIS — O34219 Maternal care for unspecified type scar from previous cesarean delivery: Secondary | ICD-10-CM | POA: Insufficient documentation

## 2013-12-27 DIAGNOSIS — O09899 Supervision of other high risk pregnancies, unspecified trimester: Secondary | ICD-10-CM | POA: Diagnosis not present

## 2013-12-27 DIAGNOSIS — O99330 Smoking (tobacco) complicating pregnancy, unspecified trimester: Secondary | ICD-10-CM | POA: Diagnosis not present

## 2013-12-27 DIAGNOSIS — D352 Benign neoplasm of pituitary gland: Secondary | ICD-10-CM | POA: Diagnosis not present

## 2013-12-27 DIAGNOSIS — O09291 Supervision of pregnancy with other poor reproductive or obstetric history, first trimester: Secondary | ICD-10-CM

## 2013-12-27 DIAGNOSIS — O3430 Maternal care for cervical incompetence, unspecified trimester: Secondary | ICD-10-CM | POA: Diagnosis present

## 2013-12-27 DIAGNOSIS — IMO0002 Reserved for concepts with insufficient information to code with codable children: Secondary | ICD-10-CM

## 2013-12-27 DIAGNOSIS — D353 Benign neoplasm of craniopharyngeal duct: Secondary | ICD-10-CM

## 2013-12-27 HISTORY — PX: CERVICAL CERCLAGE: SHX1329

## 2013-12-27 LAB — CBC
HCT: 38.2 % (ref 36.0–46.0)
Hemoglobin: 13.5 g/dL (ref 12.0–15.0)
MCH: 30.3 pg (ref 26.0–34.0)
MCHC: 35.3 g/dL (ref 30.0–36.0)
MCV: 85.7 fL (ref 78.0–100.0)
PLATELETS: 206 10*3/uL (ref 150–400)
RBC: 4.46 MIL/uL (ref 3.87–5.11)
RDW: 12.8 % (ref 11.5–15.5)
WBC: 6.9 10*3/uL (ref 4.0–10.5)

## 2013-12-27 SURGERY — CERCLAGE, CERVIX, VAGINAL APPROACH
Anesthesia: Spinal | Site: Vagina

## 2013-12-27 MED ORDER — FENTANYL CITRATE 0.05 MG/ML IJ SOLN: 25.0000 ug | INTRAMUSCULAR | Status: DC | PRN

## 2013-12-27 MED ORDER — BUPIVACAINE HCL (PF) 0.25 % IJ SOLN
INTRAMUSCULAR | Status: DC | PRN
  Administered 2013-12-27: 10 mL

## 2013-12-27 MED ORDER — SCOPOLAMINE 1 MG/3DAYS TD PT72
MEDICATED_PATCH | TRANSDERMAL | Status: AC
  Filled 2013-12-27: qty 1

## 2013-12-27 MED ORDER — PROMETHAZINE HCL 25 MG/ML IJ SOLN: 6.2500 mg | INTRAMUSCULAR | Status: DC | PRN

## 2013-12-27 MED ORDER — SCOPOLAMINE 1 MG/3DAYS TD PT72: 1.0000 | MEDICATED_PATCH | Freq: Once | TRANSDERMAL | Status: DC

## 2013-12-27 MED ORDER — LIDOCAINE IN DEXTROSE 5-7.5 % IV SOLN
INTRAVENOUS | Status: AC
  Filled 2013-12-27: qty 2

## 2013-12-27 MED ORDER — MEPERIDINE HCL 25 MG/ML IJ SOLN: 6.2500 mg | INTRAMUSCULAR | Status: DC | PRN

## 2013-12-27 MED ORDER — INDOMETHACIN 50 MG RE SUPP
100.0000 mg | Freq: Once | RECTAL | Status: DC
  Filled 2013-12-27: qty 2

## 2013-12-27 MED ORDER — 0.9 % SODIUM CHLORIDE (POUR BTL) OPTIME
TOPICAL | Status: DC | PRN
  Administered 2013-12-27: 1000 mL

## 2013-12-27 MED ORDER — LACTATED RINGERS IV SOLN: INTRAVENOUS | Status: DC

## 2013-12-27 MED ORDER — LIDOCAINE IN DEXTROSE 5-7.5 % IV SOLN
INTRAVENOUS | Status: DC | PRN
  Administered 2013-12-27: 1 mL via INTRATHECAL

## 2013-12-27 MED ORDER — INDOMETHACIN 50 MG RE SUPP
RECTAL | Status: DC | PRN
  Administered 2013-12-27: 100 mg via RECTAL

## 2013-12-27 MED ORDER — LACTATED RINGERS IV SOLN
INTRAVENOUS | Status: DC
  Administered 2013-12-27 (×3): via INTRAVENOUS

## 2013-12-27 MED ORDER — TRAMADOL HCL 50 MG PO TABS: 50.0000 mg | ORAL_TABLET | Freq: Four times a day (QID) | ORAL | Status: AC | PRN

## 2013-12-27 MED ORDER — BUPIVACAINE HCL (PF) 0.25 % IJ SOLN
INTRAMUSCULAR | Status: AC
  Filled 2013-12-27: qty 30

## 2013-12-27 MED ORDER — DOXYCYCLINE HYCLATE 100 MG PO CAPS: 100.0000 mg | ORAL_CAPSULE | Freq: Two times a day (BID) | ORAL | Status: AC

## 2013-12-27 SURGICAL SUPPLY — 18 items
CLOTH BEACON ORANGE TIMEOUT ST (SAFETY) ×3 IMPLANT
COUNTER NEEDLE 1200 MAGNETIC (NEEDLE) IMPLANT
GLOVE BIO SURGEON STRL SZ7 (GLOVE) ×3 IMPLANT
GLOVE BIOGEL PI IND STRL 7.0 (GLOVE) ×1 IMPLANT
GLOVE BIOGEL PI INDICATOR 7.0 (GLOVE) ×2
GOWN STRL REUS W/TWL LRG LVL3 (GOWN DISPOSABLE) ×6 IMPLANT
NEEDLE MAYO .5 CIRCLE (NEEDLE) ×3 IMPLANT
PACK VAGINAL MINOR WOMEN LF (CUSTOM PROCEDURE TRAY) ×3 IMPLANT
PAD OB MATERNITY 4.3X12.25 (PERSONAL CARE ITEMS) ×3 IMPLANT
PAD PREP 24X48 CUFFED NSTRL (MISCELLANEOUS) ×3 IMPLANT
SUT ETHIBOND  5 (SUTURE) ×2
SUT ETHIBOND 5 (SUTURE) ×1 IMPLANT
TOWEL OR 17X24 6PK STRL BLUE (TOWEL DISPOSABLE) ×6 IMPLANT
TRAY FOLEY CATH 14FR (SET/KITS/TRAYS/PACK) ×3 IMPLANT
TUBING NON-CON 1/4 X 20 CONN (TUBING) IMPLANT
TUBING NON-CON 1/4 X 20' CONN (TUBING)
WATER STERILE IRR 1000ML POUR (IV SOLUTION) ×3 IMPLANT
YANKAUER SUCT BULB TIP NO VENT (SUCTIONS) IMPLANT

## 2013-12-27 NOTE — Transfer of Care (Signed)
Immediate Anesthesia Transfer of Care Note  Patient: Evelyn Thornton  Procedure(s) Performed: Procedure(s): CERCLAGE CERVICAL (N/A)  Patient Location: PACU  Anesthesia Type:Spinal  Level of Consciousness: awake, alert  and oriented  Airway & Oxygen Therapy: Patient Spontanous Breathing  Post-op Assessment: Report given to PACU RN and Post -op Vital signs reviewed and stable  Post vital signs: Reviewed and stable  Complications: No apparent anesthesia complications

## 2013-12-27 NOTE — Op Note (Signed)
12/27/2013  10:39 AM  PATIENT:  Evelyn Thornton  28 y.o. female  PRE-OPERATIVE DIAGNOSIS:  cpt 59320 - Incompetent cervix  POST-OPERATIVE DIAGNOSIS:  cpt 14970 - Incompetent cervix  PROCEDURE:  Procedure(s): CERCLAGE CERVICAL (N/A)  SURGEON:  Surgeon(s) and Role:    * Lavonia Drafts, MD - Primary  ANESTHESIA:   spinal  EBL:  Total I/O In: 1700 [I.V.:1700] Out: -   BLOOD ADMINISTERED:none  DRAINS: none   LOCAL MEDICATIONS USED:  MARCAINE     SPECIMEN:  No Specimen  DISPOSITION OF SPECIMEN:  N/A  COUNTS:  YES  TOURNIQUET:  * No tourniquets in log *  DICTATION: .Note written in EPIC  PLAN OF CARE: Discharge to home after PACU  PATIENT DISPOSITION:  PACU - hemodynamically stable.   Delay start of Pharmacological VTE agent (>24hrs) due to surgical blood loss or risk of bleeding: not applicable  COMPLICATIONS: None immediate  INDICATIONS: 28 y.o. [id number] at [redacted]w[redacted]d with history of cervical incompetence, here for cerclage placement.   The risks of surgery were discussed in detail with the patient including but not limited to: bleeding; infection which may require antibiotic therapy; injury to cervix, vagina other surrounding organs; risk of ruptured membranes and/or preterm delivery and other postoperative or anesthesia complications.  Written informed consent was obtained.    FINDINGS:  About 4 cm palpable cervical length in the vagina, closed cervix, previous scarring noted from past cerclage x2  PROCEDURE IN DETAIL:  The patient had sequential compression devices applied to her lower extremities while in the preoperative area. She was then taken to the operating room where spinal anesthesia was administered and was found to be adequate.  She was placed in the dorsal lithotomy, and was prepped and draped in a sterile manner. Her bladder was catheterized for an unmeasured amount of clear, yellow urine.  After an adequate timeout was performed, a vaginal speculum was  then placed in the patient's vagina and a single tooth tenaculum was applied to the anterior lip of the cervix.   10 cc of 0.5% Marcaine was placed as a paracervical block.  The anterior and posterior lips of the cervix were grasped with Ringed forceps. A curved needle loaded with a number 5 Ethibond suture was inserted at 12 o'clock, as high as possible at the junction of the rugated vaginal epithelium and the smooth cervix, at least 3.5 cm above the external os.  Five bites were taken circumferentially around the entire cervix in a purse-string fashion, each bite extended midway into the cervical stroma, but not into the endocervical canal. (an attempt was made to go between the areas where the previous cerclage was placed due to potential scaring) The two ends of the suture were then tied securely on the patients right side and cut, leaving the ends long. There was minimal bleeding noted and the Ringed forceps were removed with good hemostasis noted.  All instruments were removed from the patient's vagina.  Instrument, needle and sponge counts were correct x 2. The patient tolerated the procedure well, and was taken to the recovery area awake and in stable condition.  Indomethacin 100 mg rectal suppository was placed. Reassuring fetal heart rate was also obtained using a doppler in the recovery area.  The patient will be discharged to home as per PACU criteria.  Routine postoperative instructions given.  She was prescribed Ultram and Doxyclcline.  She will follow up in the clinic in 2 weeks for postoperative evaluation and ongoing prenatal care.

## 2013-12-27 NOTE — Anesthesia Procedure Notes (Signed)
Spinal  Patient location during procedure: OR Start time: 12/27/2013 9:49 AM Staffing Anesthesiologist: CASSIDY, AMY Performed by: anesthesiologist  Preanesthetic Checklist Completed: patient identified, site marked, surgical consent, pre-op evaluation, timeout performed, IV checked, risks and benefits discussed and monitors and equipment checked Spinal Block Patient position: sitting Prep: site prepped and draped and DuraPrep Patient monitoring: heart rate, cardiac monitor, continuous pulse ox and blood pressure Approach: midline Location: L3-4 Injection technique: single-shot Needle Needle type: Pencan  Needle gauge: 24 G Needle length: 9 cm Assessment Sensory level: T4 Additional Notes Clear free flow CSF on first attempt.  No paresthesia.  Patient tolerated procedure well with no apparent complications.  Charlton Haws, MD

## 2013-12-27 NOTE — Progress Notes (Signed)
At about 1400, I noticed that Evelyn Thornton was no longer in the Phase II area.  Apparently, one of the other PACU nurses had drawn the curtain between her patient and mine, and Evelyn Thornton must have taken this opportunity to leave the unit by the door behind the curtain where she was sitting.  She had been told several times by me that it was not safe for her to drive herself home, and that she would have to find someone to take her there.   At about 1500, I telephoned her at [phone].  She answered the call, and said "I know you must be mad at me".  I told her that yes, I was, but that I was calling to make sure that she had made it home safely.  She said that she had.

## 2013-12-27 NOTE — Discharge Instructions (Signed)
°

## 2013-12-27 NOTE — Brief Op Note (Signed)
12/27/2013  10:39 AM  PATIENT:  Evelyn Thornton  28 y.o. female  PRE-OPERATIVE DIAGNOSIS:  cpt 59320 - Incompetent cervix  POST-OPERATIVE DIAGNOSIS:  cpt 80321 - Incompetent cervix  PROCEDURE:  Procedure(s): CERCLAGE CERVICAL (N/A)  SURGEON:  Surgeon(s) and Role:    * Lavonia Drafts, MD - Primary  ANESTHESIA:   spinal  EBL:  Total I/O In: 1700 [I.V.:1700] Out: -   BLOOD ADMINISTERED:none  DRAINS: none   LOCAL MEDICATIONS USED:  MARCAINE     SPECIMEN:  No Specimen  DISPOSITION OF SPECIMEN:  N/A  COUNTS:  YES  TOURNIQUET:  * No tourniquets in log *  DICTATION: .Note written in EPIC  PLAN OF CARE: Discharge to home after PACU  PATIENT DISPOSITION:  PACU - hemodynamically stable.   Delay start of Pharmacological VTE agent (>24hrs) due to surgical blood loss or risk of bleeding: not applicable

## 2013-12-27 NOTE — Anesthesia Preprocedure Evaluation (Signed)
Anesthesia Evaluation  Patient identified by MRN, date of birth, ID band Patient awake    Reviewed: Allergy & Precautions, H&P , NPO status , Patient's Chart, lab work & pertinent test results, reviewed documented beta blocker date and time   History of Anesthesia Complications (+) history of anesthetic complications (not a spinal headache - had a HA after a C/S that lasted for 2 hrs)  Airway Mallampati: II TM Distance: >3 FB Neck ROM: full    Dental  (+) Teeth Intact   Pulmonary Current Smoker,  breath sounds clear to auscultation        Cardiovascular negative cardio ROS  Rhythm:regular Rate:Normal     Neuro/Psych  Headaches (recent increase in headaches (relates to quitting caffeine with pregnancy)), Pituitary adenoma - last imaged 2 years ago.  Supposed to have it imaged every year.  Hormone testing in 4/15 was negative.  No current visual symptoms.  Just moved here and prior to moving was told by ophthalmologist her ocular pressures were elevated and she needed to have her pituitary adenoma checked. negative psych ROS   GI/Hepatic negative GI ROS, Neg liver ROS,   Endo/Other  negative endocrine ROS  Renal/GU negative Renal ROS  negative genitourinary   Musculoskeletal   Abdominal   Peds  Hematology  (+) Sickle cell trait ,   Anesthesia Other Findings   Reproductive/Obstetrics (+) Pregnancy (incompetent cervix, 13 weeks)                           Anesthesia Physical Anesthesia Plan  ASA: II  Anesthesia Plan: Spinal   Post-op Pain Management:    Induction:   Airway Management Planned:   Additional Equipment:   Intra-op Plan:   Post-operative Plan:   Informed Consent: I have reviewed the patients History and Physical, chart, labs and discussed the procedure including the risks, benefits and alternatives for the proposed anesthesia with the patient or authorized representative who  has indicated his/her understanding and acceptance.     Plan Discussed with: Surgeon and CRNA  Anesthesia Plan Comments:         Anesthesia Quick Evaluation

## 2013-12-27 NOTE — Anesthesia Postprocedure Evaluation (Signed)
  Anesthesia Post-op Note Anesthesia Post Note  Patient: Evelyn Thornton  Procedure(s) Performed: Procedure(s) (LRB): CERCLAGE CERVICAL (N/A)  Anesthesia type: Spinal  Patient location: PACU  Post pain: Pain level controlled  Post assessment: Post-op Vital signs reviewed  Last Vitals:  Filed Vitals:   12/27/13 1245  BP: 111/73  Pulse: 62  Temp: 36.9 C  Resp: 16    Post vital signs: Reviewed  Level of consciousness: awake  Complications: No apparent anesthesia complications

## 2013-12-27 NOTE — H&P (Signed)
Evelyn Thornton is a [id number] [redacted]w[redacted]d being seen today for her first obstetrical visit. 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 Pregnancy history fully reviewed.  Patient reports headache.  Filed Vitals:    12/09/13 0908  12/09/13 0911   BP:  112/74    Pulse:  91    Temp:  98.4 F (36.9 C)    Height:   5\' 2"  (1.575 m)   Weight:  168 lb 8 oz (76.431 kg)    HISTORY:  OB History   Gravida  Para  Term  Preterm  AB  SAB  TAB  Ectopic  Multiple  Living   4  2  2   1  1     2      #  Outcome  Date  GA  Lbr Len/2nd  Weight  Sex  Delivery  Anes  PTL  Lv   4  CUR            3  TRM  01/24/08  [redacted]w[redacted]d   7 lb 10 oz (3.459 kg)  F  CS      Comments: cerclage, 17P, repeat c/s   2  TRM  09/01/06  [redacted]w[redacted]d   7 lb 6 oz (3.345 kg)   CS      Comments: Induced for postdates, had cerclage and 17P shots. c/s because face up, over 24 hour induction, born in [place], [place]   1  SAB  06/2005           Comments: miscarriaged at 20 wk, found to have incompetent cervix, did cerclage but miscarriages.      Past Medical History   Diagnosis  Date   .  Pituitary adenoma    .  Complication of anesthesia    .  Spinal headache    .  Sickle cell trait     Past Surgical History   Procedure  Laterality  Date   .  Cesarean section      Family History   Problem  Relation  Age of Onset   .  Hypertension  Mother    .  Hypertension  Maternal Grandmother    .  Hypertension  Paternal Grandmother    .  Diabetes  Maternal Grandfather     Exam   BP 124/79  Pulse 80  Temp(Src) 98.1 F (36.7 C) (Oral)  Resp 18  SpO2 98%  LMP 09/30/2013  Uterus:  10 wk size   Pelvic Exam:     Perineum:  Normal Perineum    Vulva:  Bartholin's, Urethra, Skene's normal    Vagina:  normal mucosa, curdlike discharge    Cervix:  no bleeding following Pap and no lesions    Adnexa:  normal adnexa    Bony Pelvis:  average   System:   Breast:  normal appearance, no masses or tenderness    Skin:  normal coloration and turgor, no rashes    Neurologic:  oriented    Extremities:  normal strength, tone, and muscle mass    HEENT  sclera clear, anicteric    Mouth/Teeth  mucous membranes moist, pharynx normal without lesions and dental hygiene good    Neck  supple    Cardiovascular:  regular rate and rhythm    Respiratory:  appears well, vitals normal, no respiratory distress, acyanotic, normal RR, ear and throat exam is normal, neck free of mass or lymphadenopathy, chest clear, no  wheezing, crepitations, rhonchi, normal symmetric air entry    Abdomen:  soft, non-tender; bowel sounds normal; no masses, no organomegaly    CBC    Component Value Date/Time   WBC 6.9 12/27/2013 0830   RBC 4.46 12/27/2013 0830   HGB 13.5 12/27/2013 0830   HCT 38.2 12/27/2013 0830   PLT 206 12/27/2013 0830   MCV 85.7 12/27/2013 0830   MCH 30.3 12/27/2013 0830   MCHC 35.3 12/27/2013 0830   RDW 12.8 12/27/2013 0830   LYMPHSABS 1.8 12/09/2013 1424   MONOABS 0.6 12/09/2013 1424   EOSABS 0.1 12/09/2013 1424   BASOSABS 0.0 12/09/2013 1424     Assessment:   Pregnancy: [id number]  Patient Active Problem List    Diagnosis  Date Noted   .  Supervision of high-risk pregnancy  12/09/2013     Priority: High   .  Previous cesarean delivery, antepartum condition or complication  82/50/0370     Priority: Medium   .  Tobacco use affecting pregnancy, antepartum  12/09/2013     Priority: Medium   .  History of incompetent cervix, currently pregnant  12/09/2013     Priority: Medium   .  Pituitary adenoma  12/09/2013    A: cervical insufficiency  P: Patient desires surgical management with [place] cerclage.  The risks of surgery were discussed in detail with the patient including but not limited to: bleeding which may require transfusion or reoperation; infection which may require prolonged hospitalization or re-hospitalization and antibiotic therapy; injury to  bowel, bladder, ureters and major vessels or other surrounding organs; need for additional procedures including laparotomy; thromboembolic phenomenon, incisional problems and other postoperative or anesthesia complications.  Patient was told that the likelihood that her condition and symptoms will be treated effectively with this surgical management was very high; the postoperative expectations were also discussed in detail. The patient also understands the alternative treatment options which were discussed in full. All questions were answered.

## 2013-12-29 ENCOUNTER — Inpatient Hospital Stay (HOSPITAL_COMMUNITY)
Admission: AD | Admit: 2013-12-29 | Discharge: 2013-12-30 | Disposition: A | Discharge status: Home / Self Care | Payer: Medicaid Other | Source: Ambulatory Visit | Attending: Obstetrics & Gynecology | Admitting: Obstetrics & Gynecology

## 2013-12-29 DIAGNOSIS — O3430 Maternal care for cervical incompetence, unspecified trimester: Secondary | ICD-10-CM | POA: Insufficient documentation

## 2013-12-29 DIAGNOSIS — O09899 Supervision of other high risk pregnancies, unspecified trimester: Secondary | ICD-10-CM | POA: Insufficient documentation

## 2013-12-29 DIAGNOSIS — O209 Hemorrhage in early pregnancy, unspecified: Secondary | ICD-10-CM | POA: Diagnosis not present

## 2013-12-29 DIAGNOSIS — O99330 Smoking (tobacco) complicating pregnancy, unspecified trimester: Secondary | ICD-10-CM | POA: Insufficient documentation

## 2013-12-29 DIAGNOSIS — O34219 Maternal care for unspecified type scar from previous cesarean delivery: Secondary | ICD-10-CM

## 2013-12-29 DIAGNOSIS — G8918 Other acute postprocedural pain: Secondary | ICD-10-CM

## 2013-12-29 DIAGNOSIS — O3432 Maternal care for cervical incompetence, second trimester: Secondary | ICD-10-CM

## 2013-12-29 DIAGNOSIS — O0990 Supervision of high risk pregnancy, unspecified, unspecified trimester: Secondary | ICD-10-CM

## 2013-12-29 NOTE — MAU Note (Signed)
Pt reports she had a cerclage on 08/28, had brownish discharge first 1-2 days and today she is having bright red bleeding on tissue when she wipes. Also having lower abd pain.

## 2013-12-30 ENCOUNTER — Telehealth: Payer: Self-pay

## 2013-12-30 ENCOUNTER — Inpatient Hospital Stay (HOSPITAL_COMMUNITY): Payer: Medicaid Other

## 2013-12-30 ENCOUNTER — Encounter (HOSPITAL_COMMUNITY): Payer: Self-pay | Admitting: *Deleted

## 2013-12-30 DIAGNOSIS — O0990 Supervision of high risk pregnancy, unspecified, unspecified trimester: Secondary | ICD-10-CM

## 2013-12-30 DIAGNOSIS — D352 Benign neoplasm of pituitary gland: Secondary | ICD-10-CM

## 2013-12-30 DIAGNOSIS — O26859 Spotting complicating pregnancy, unspecified trimester: Secondary | ICD-10-CM

## 2013-12-30 LAB — CBC
HEMATOCRIT: 34.7 % — AB (ref 36.0–46.0)
HEMOGLOBIN: 12.2 g/dL (ref 12.0–15.0)
MCH: 30.1 pg (ref 26.0–34.0)
MCHC: 35.2 g/dL (ref 30.0–36.0)
MCV: 85.7 fL (ref 78.0–100.0)
Platelets: 184 10*3/uL (ref 150–400)
RBC: 4.05 MIL/uL (ref 3.87–5.11)
RDW: 12.7 % (ref 11.5–15.5)
WBC: 6.9 10*3/uL (ref 4.0–10.5)

## 2013-12-30 LAB — URINE MICROSCOPIC-ADD ON

## 2013-12-30 LAB — URINALYSIS, ROUTINE W REFLEX MICROSCOPIC
Bilirubin Urine: NEGATIVE
GLUCOSE, UA: NEGATIVE mg/dL
KETONES UR: NEGATIVE mg/dL
Nitrite: NEGATIVE
Protein, ur: NEGATIVE mg/dL
Specific Gravity, Urine: 1.015 (ref 1.005–1.030)
UROBILINOGEN UA: 0.2 mg/dL (ref 0.0–1.0)
pH: 6 (ref 5.0–8.0)

## 2013-12-30 MED ORDER — IBUPROFEN 600 MG PO TABS
600.0000 mg | ORAL_TABLET | Freq: Once | ORAL | Status: AC
  Administered 2013-12-30: 600 mg via ORAL
  Filled 2013-12-30: qty 1

## 2013-12-30 NOTE — Telephone Encounter (Signed)
Received call from [facility] (Diane, new patient scheduler). Chart was reviewed in full and patient's insurance (medicaid) states that PCP is [facility] in [place]. Diane tried to call this practice however she could not reach anyone. She requests we get in touch with patient and determine who she sees for PCP-- if she no longer goes to that practice she will need to have it changed on medicaid card and have appointment with PCP for referral to neuro as they will need to authorize the neuro appointment and any procedure thereafter. Called patient who stated we do not have to work on referral because she is moving back home (out of [place]) Wednesday and will be returning to her [facility] at home. States she already has an appointment with OB and Endocrinologist and if needed she will get neuro scheduled with them as well. She states she will no longer be in [place] or receiving treatment in our office. Referral no longer needed. Called Diane at [facility] and informed her as well.

## 2013-12-30 NOTE — Discharge Instructions (Signed)
Cervical Insufficiency °Cervical insufficiency is when the cervix is weak and starts to open (dilate) and thin (efface) before the pregnancy is at term and without labor starting. This is also called incompetent cervix. It can happen in the second or third trimester when the fetus starts putting pressure on the cervix. Cervical insufficiency can lead to a miscarriage, preterm premature rupture of the membranes (PPROM), or having the baby early (preterm birth).  °RISK FACTORS °You may be more likely to develop cervical insufficiency if: °· You have a shorter cervix than normal. °· Damage or injury occurred to your cervix from a past pregnancy or surgery. °· You were born with a cervical defect. °· You have had a procedure done on the cervix, such as cervical biopsy. °· You have a history of cervical insufficiency. °· You have a history of PPROM. °· You have ended several past pregnancies through abortion. °· You were exposed to the drug diethylstilbestrol (DES). °SYMPTOMS °Often times, women do not have any symptoms. Other times, women may only have mild symptoms that often start between week 14 through 20. The symptoms may last several days or weeks. These symptoms include: °· Light spotting or bleeding from the vagina. °· Pelvic pressure. °· A change in vaginal discharge, such as discharge that changes from clear, white, or light yellow to pink or tan. °· Back pain. °· Abdominal pain or cramping. °DIAGNOSIS °Cervical insufficiency cannot be diagnosed before you become pregnant. Once you are pregnant, your health care provider will ask about your medical history and if you have had any problems in past pregnancies. Tell your health care provider about any procedures performed on your cervix or if you have a history of miscarriages or cervical insufficiency. If your health care provider thinks you are at high risk for cervical insufficiency or show signs of cervical insufficiency, he or she may: °· Perform a pelvic  exam. This will check for: °¨ The presence of the membranes (amniotic sac) coming out of the cervix. °¨ Cervical abnormalities. °¨ Cervical injuries. °¨ The presence of contractions. °· Perform an ultrasonography (commonly called ultrasound) to measure the length and thickness of the cervix. °TREATMENT °If you have been diagnosed with cervical insufficiency, your health care provider may recommend: °· Limiting physical activity. °· Bed rest at home or in the hospital. °· Pelvic rest, which means no sexual intercourse or placing anything in the vagina. °· Cerclage to sew the cervix closed and prevent it from opening too early. The stitches (sutures) are removed between weeks 36 and 38 to avoid problems during labor. °Cerclage may be recommended during pregnancy if you have had a history of miscarriages or preterm births without a known cause. It may also be recommended if you have a short cervix that was identified by ultrasound or if your health care provider has found that your cervix has dilated before 24 weeks of pregnancy. Limiting physical activity and bed rest may or may not help prevent a preterm birth. °WHEN SHOULD YOU SEEK IMMEDIATE MEDICAL CARE?  °Seek immediate medical care if you show any symptoms of cervical insufficiency. You will need to go to the hospital to get checked immediately. °Document Released: 04/18/2005 Document Revised: 09/02/2013 Document Reviewed: 06/25/2012 °ExitCare® Patient Information ©2015 [employer]. This information is not intended to replace advice given to you by your health care provider. Make sure you discuss any questions you have with your health care provider. ° °

## 2013-12-30 NOTE — MAU Provider Note (Signed)
History     CSN: [id number]  Arrival date and time: 12/29/13 2332   First Provider Initiated Contact with Patient 12/30/13 [phone]      Chief Complaint  Patient presents with  . Vaginal Bleeding  . Abdominal Pain   Vaginal Bleeding Associated symptoms include abdominal pain.  Abdominal Pain   Evelyn Thornton is a 28 y.o. [id number] at [redacted]w[redacted]d who presents today with vaginal bleeding after a cerclage. She had a cerclage placed on 12/27/13. She states that she has had brown spotting since it was placed, but today it became bright red again, and she also started to have a lot of cramps. She states that the bleeding is only noticed when she wipes. She has been taking her antibiotics as prescribed. She denies any fever, and she has not taken anything for the cramping at this time.   Past Medical History  Diagnosis Date  . Pituitary adenoma   . Sickle cell trait     Past Surgical History  Procedure Laterality Date  . Cesarean section      Family History  Problem Relation Age of Onset  . Hypertension Mother   . Hypertension Maternal Grandmother   . Hypertension Paternal Grandmother   . Diabetes Maternal Grandfather     History  Substance Use Topics  . Smoking status: Current Every Day Smoker -- 0.25 packs/day    Types: Cigarettes  . Smokeless tobacco: Never Used  . Alcohol Use: No    Allergies:  Allergies  Allergen Reactions  . Vicodin [Hydrocodone-Acetaminophen] Anaphylaxis    Prescriptions prior to admission  Medication Sig Dispense Refill  . doxycycline (VIBRAMYCIN) 100 MG capsule Take 1 capsule (100 mg total) by mouth 2 (two) times daily.  10 capsule  0  . Prenatal Vit-Fe Fumarate-FA (PRENATAL MULTIVITAMIN) TABS tablet Take 1 tablet by mouth daily at 12 noon.      . traMADol (ULTRAM) 50 MG tablet Take 1 tablet (50 mg total) by mouth every 6 (six) hours as needed for severe pain.  15 tablet  0    Review of Systems  Gastrointestinal: Positive for abdominal pain.   Genitourinary: Positive for vaginal bleeding.   Physical Exam   Blood pressure 117/65, pulse 127, temperature 98.3 F (36.8 C), temperature source Oral, resp. rate 18, height 5\' 2"  (1.575 m), weight 78.472 kg (173 lb), last menstrual period 09/30/2013, SpO2 99.00%.  Physical Exam  Nursing note and vitals reviewed. Constitutional: She appears well-developed and well-nourished. No distress.  Cardiovascular: Normal rate.   Respiratory: Effort normal.  GI: Soft. There is no tenderness. There is no rebound.  Genitourinary:   External: no lesion Vagina: small amount of dark red/brown blood Cervix: pink, smooth, no CMT, suture visible Uterus: AGA, slightly tender  Adnexa: NT   Neurological: She is alert.  Skin: Skin is warm and dry.  Psychiatric: She has a normal mood and affect.    MAU Course  Procedures Results for orders placed during the hospital encounter of 12/29/13 (from the past 24 hour(s))  URINALYSIS, ROUTINE W REFLEX MICROSCOPIC     Status: Abnormal   Collection Time    12/29/13 11:52 PM      Result Value Ref Range   Color, Urine YELLOW  YELLOW   APPearance CLEAR  CLEAR   Specific Gravity, Urine 1.015  1.005 - 1.030   pH 6.0  5.0 - 8.0   Glucose, UA NEGATIVE  NEGATIVE mg/dL   Hgb urine dipstick LARGE (*) NEGATIVE   Bilirubin Urine NEGATIVE  NEGATIVE   Ketones, ur NEGATIVE  NEGATIVE mg/dL   Protein, ur NEGATIVE  NEGATIVE mg/dL   Urobilinogen, UA 0.2  0.0 - 1.0 mg/dL   Nitrite NEGATIVE  NEGATIVE   Leukocytes, UA SMALL (*) NEGATIVE  URINE MICROSCOPIC-ADD ON     Status: None   Collection Time    12/29/13 11:52 PM      Result Value Ref Range   Squamous Epithelial / LPF RARE  RARE   WBC, UA 0-2  <3 WBC/hpf   RBC / HPF 3-6  <3 RBC/hpf   Bacteria, UA RARE  RARE  CBC     Status: Abnormal   Collection Time    12/30/13 12:45 AM      Result Value Ref Range   WBC 6.9  4.0 - 10.5 K/uL   RBC 4.05  3.87 - 5.11 MIL/uL   Hemoglobin 12.2  12.0 - 15.0 g/dL   HCT 34.7 (*)  36.0 - 46.0 %   MCV 85.7  78.0 - 100.0 fL   MCH 30.1  26.0 - 34.0 pg   MCHC 35.2  30.0 - 36.0 g/dL   RDW 12.7  11.5 - 15.5 %   Platelets 184  150 - 400 K/uL   US Ob Comp Less 14 Wks  12/30/2013   CLINICAL DATA:  Bleeding status post cerclage.  EXAM: OBSTETRIC <14 WK [place] AND TRANSVAGINAL OB US  TECHNIQUE: Both transabdominal and transvaginal ultrasound examinations were performed for complete evaluation of the gestation as well as the maternal uterus, adnexal regions, and pelvic cul-de-sac. Transvaginal technique was performed to assess early pregnancy.  COMPARISON:  11/27/2013  FINDINGS: Intrauterine gestational sac: Visualized/normal in shape.  Yolk sac:  Not identified  Embryo:  Identified  Cardiac Activity: Identified  Heart Rate:  158 bpm  CRL:   70  mm   13 w 1d                  [place] EDC: 07/06/2014  Maternal uterus/adnexae: No subchorionic hemorrhage. Normal sonographic appearance to the ovaries. No free fluid. The cervix appears closed with cerclage visualized.  IMPRESSION: Intrauterine gestation with cardiac activity documented. Estimated age of 36 weeks 1 day by crown-rump length.  Cervix appears closed.   Electronically Signed   By: Carlos Levering M.D.   On: 12/30/2013 02:18   US Ob Transvaginal  12/30/2013   CLINICAL DATA:  Bleeding status post cerclage.  EXAM: OBSTETRIC <14 WK [place] AND TRANSVAGINAL OB US  TECHNIQUE: Both transabdominal and transvaginal ultrasound examinations were performed for complete evaluation of the gestation as well as the maternal uterus, adnexal regions, and pelvic cul-de-sac. Transvaginal technique was performed to assess early pregnancy.  COMPARISON:  11/27/2013  FINDINGS: Intrauterine gestational sac: Visualized/normal in shape.  Yolk sac:  Not identified  Embryo:  Identified  Cardiac Activity: Identified  Heart Rate:  158 bpm  CRL:   70  mm   13 w 1d                  [place] EDC: 07/06/2014  Maternal uterus/adnexae: No subchorionic hemorrhage. Normal sonographic appearance  to the ovaries. No free fluid. The cervix appears closed with cerclage visualized.  IMPRESSION: Intrauterine gestation with cardiac activity documented. Estimated age of 66 weeks 1 day by crown-rump length.  Cervix appears closed.   Electronically Signed   By: Carlos Levering M.D.   On: 12/30/2013 02:18   0226: Patient reports that her pain has improved with ibuprofen.   Assessment  and Plan   1. Unspecified high-risk pregnancy   2. Previous cesarean delivery, antepartum condition or complication   3. Tobacco use affecting pregnancy, antepartum   4. Post-operative pain   5. Cervical cerclage suture present in second trimester    Bleeding precautions Second trimester danger signs  Return to MAU as needed  Follow-up Information   Follow up with [facility]. (As scheduled)    Specialty:  Obstetrics and Gynecology   Contact information:   [place] [address] [phone]       Mathis Bud 12/30/2013, 12:42 AM

## 2013-12-30 NOTE — Telephone Encounter (Addendum)
[facility] at [phone]-- transferred to voicemail of new patient coordinator Diane-- left message informing her I would fax over referral form with current office notes and to call clinic if any further information is needed. Information to be Faxed to [phone].  Awaiting to discuss patient with Dr. Ihor Dow here in clinic-- cannot see CT report showing pituitary adenoma.   1103: Received call from [place], new patient coordinator, who stated referral can be put into [employer] into her work cue. Informed her as soon as I have record of patient diagnosis I will send referral. Diane states she will call clinic if she does not see referral by end of day.   1400: Spoke to Dr. Ihor Dow who stated patient informed providers of this and CT scan had been performed elsewhere. Referral to [facility] entered in [facility] per request of Diane. Called Diane-- left message stating she should now be able to see referral in EPIC-- also stated referral is imperative as patient is pregnant and anesthesia is needing consult prior to delivery. Patient to be called with appointment.

## 2013-12-30 NOTE — Telephone Encounter (Signed)
Message copied by Geanie Logan on Mon Dec 30, 2013 10:22 AM ------      Message from: Lavonia Drafts      Created: Fri Dec 27, 2013 11:01 AM       Pt needs referral to Neuro for further eval of pituitary adenoma.  Please schedule.            Thx,      clh-S ------

## 2014-01-09 ENCOUNTER — Encounter: Payer: Self-pay | Admitting: Family

## 2014-01-09 ENCOUNTER — Encounter: Payer: Medicaid Other | Admitting: Family

## 2014-01-13 ENCOUNTER — Encounter: Payer: Medicaid Other | Admitting: Obstetrics and Gynecology

## 2014-01-13 ENCOUNTER — Encounter: Payer: Self-pay | Admitting: Obstetrics and Gynecology

## 2014-02-25 ENCOUNTER — Encounter: Payer: Self-pay | Admitting: General Practice

## 2014-03-04 ENCOUNTER — Encounter (HOSPITAL_COMMUNITY): Payer: Self-pay | Admitting: *Deleted

## 2014-03-26 ENCOUNTER — Encounter: Payer: Self-pay | Admitting: Obstetrics & Gynecology

## 2014-10-02 ENCOUNTER — Encounter (HOSPITAL_COMMUNITY): Payer: Self-pay | Admitting: *Deleted

## 2015-01-14 IMAGING — US US OB COMP LESS 14 WK
1 series · 14 of 28 positions shown · non-contrast
Comparison: None.

CLINICAL DATA: Pelvic pain.

EXAM:
OBSTETRIC <14 WK US AND TRANSVAGINAL OB US
TECHNIQUE: Both transabdominal and transvaginal ultrasound examinations were
performed for complete evaluation of the gestation as well as the
maternal uterus, adnexal regions, and pelvic cul-de-sac.
Transvaginal technique was performed to assess early pregnancy.

[Series 1: us ob comp less 14 wks · 14 of 35 slices shown]
[im 2/35]
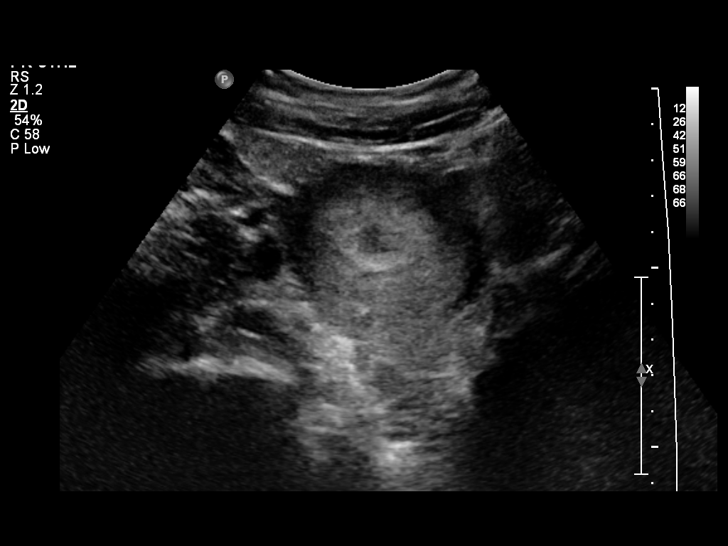
[im 4/35]
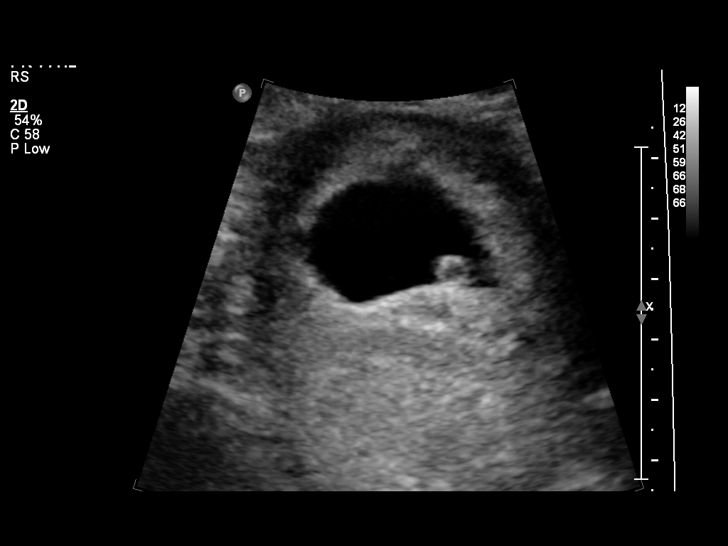
[im 7/35]
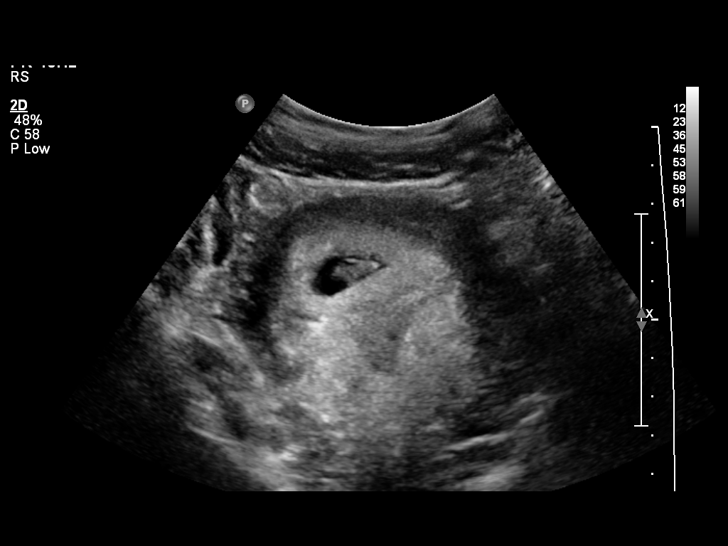
[im 9/35]
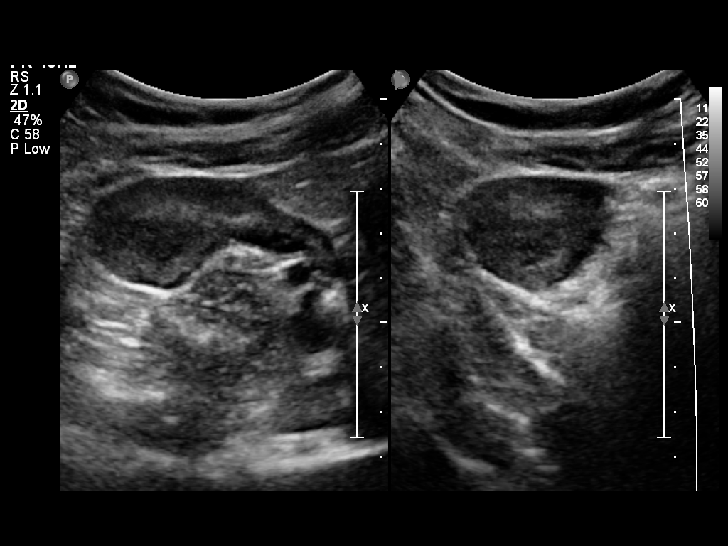
[im 12/35]
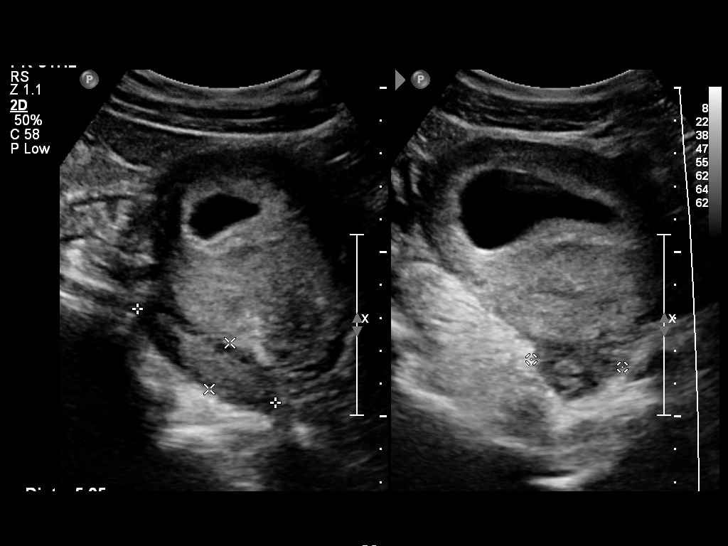
[im 14/35]
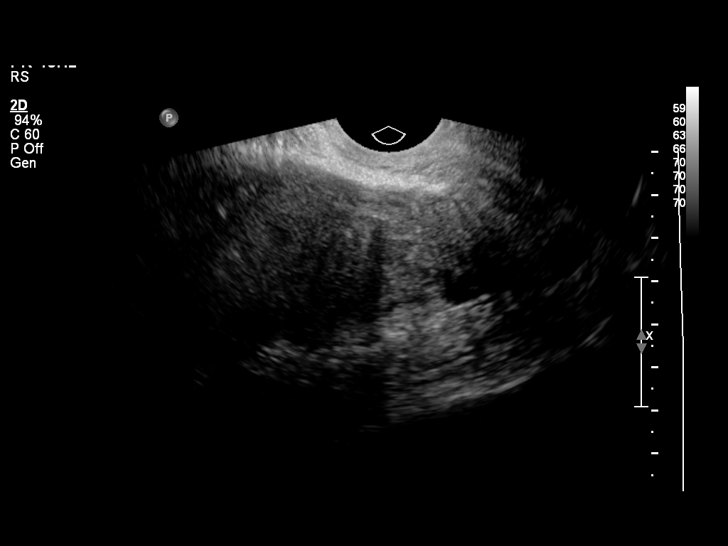
[im 17/35]
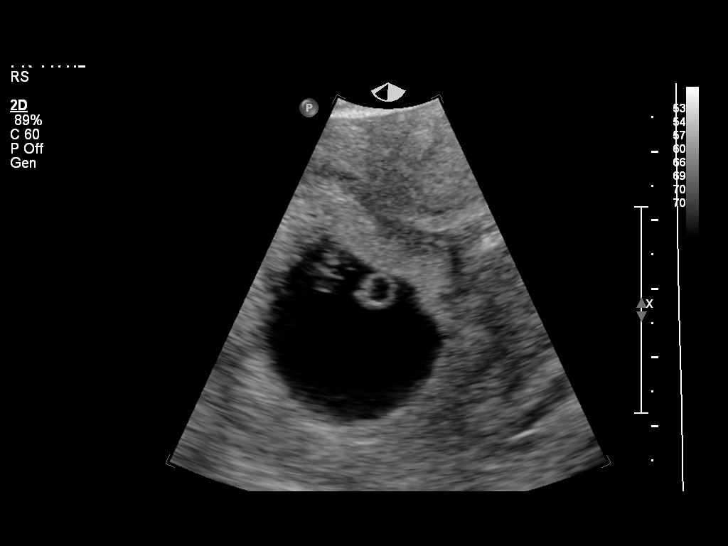
[im 19/35]
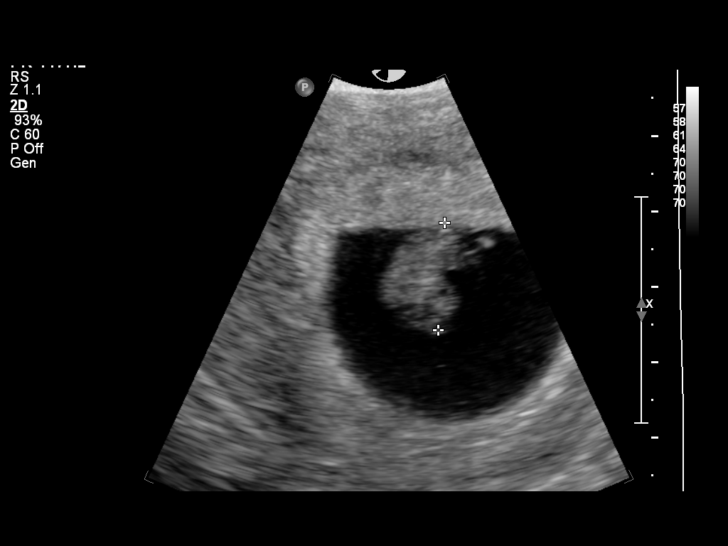
[im 22/35]
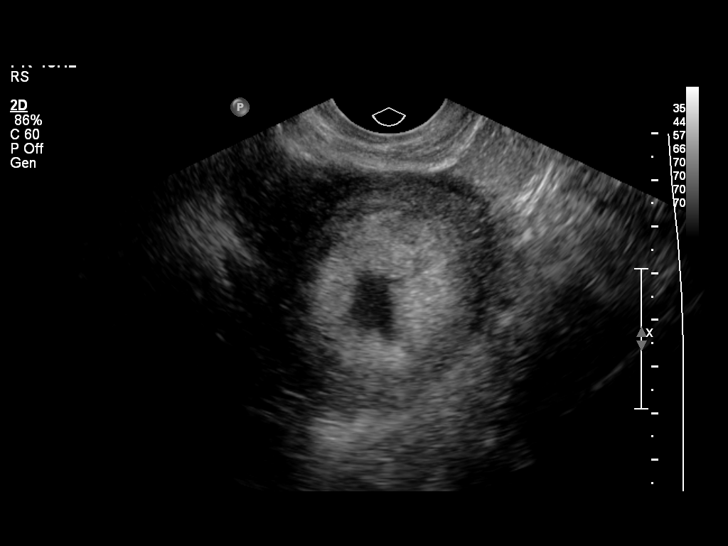
[im 24/35]
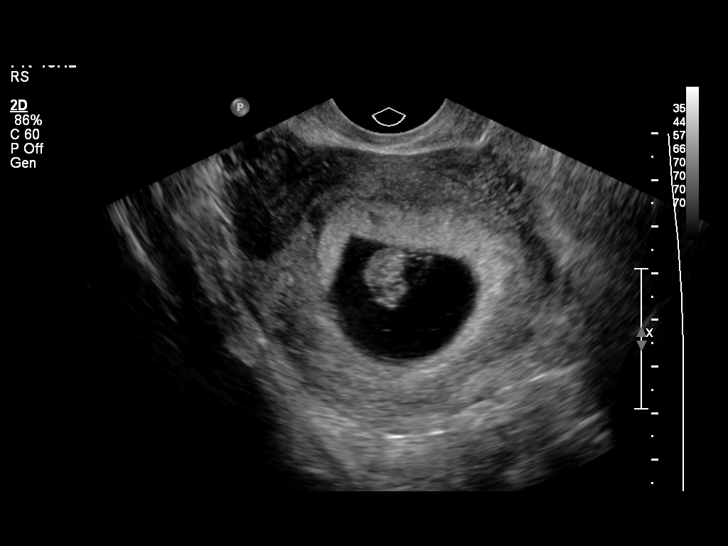
[im 27/35]
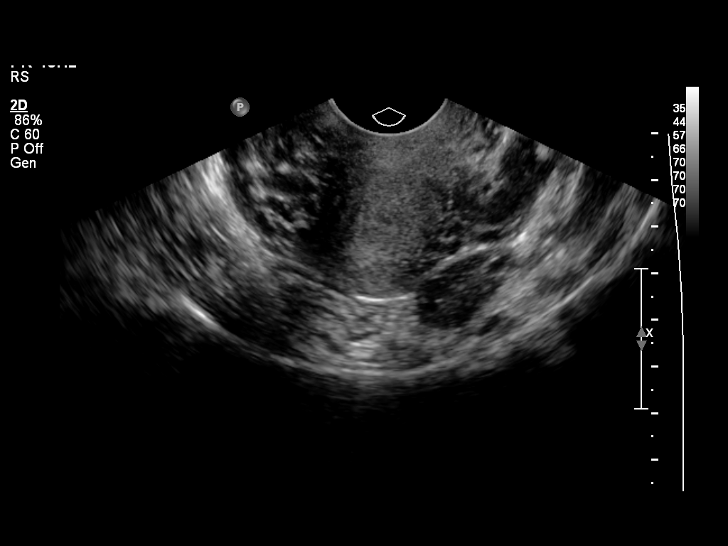
[im 29/35]
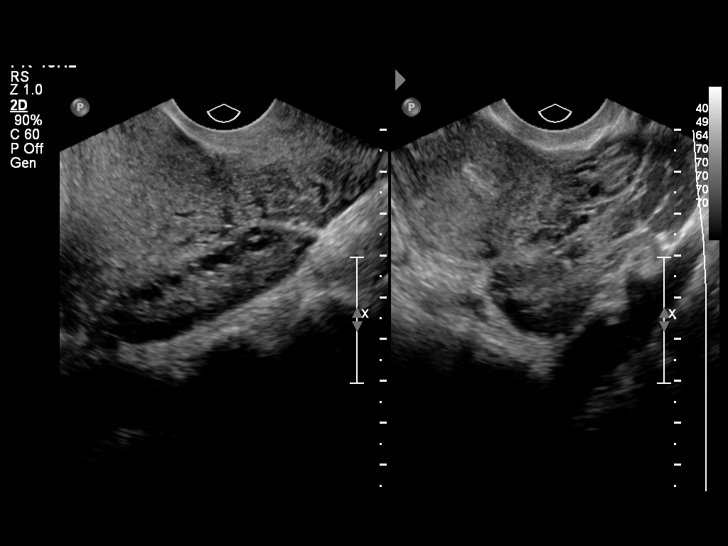
[im 32/35]
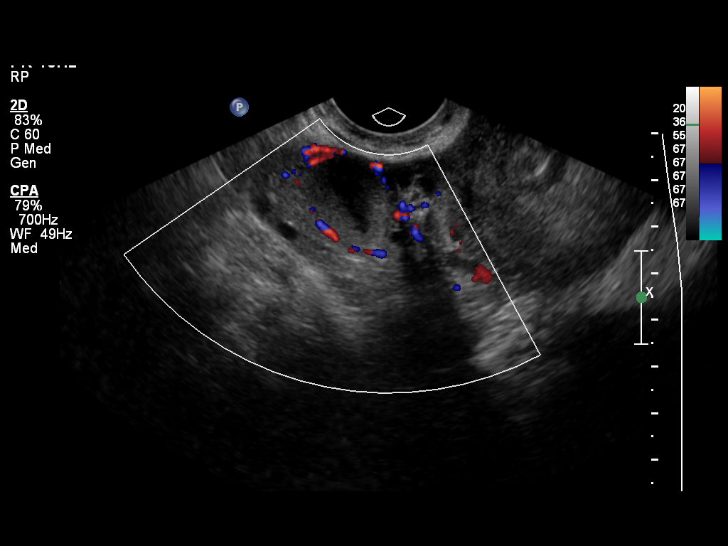
[im 35/35]
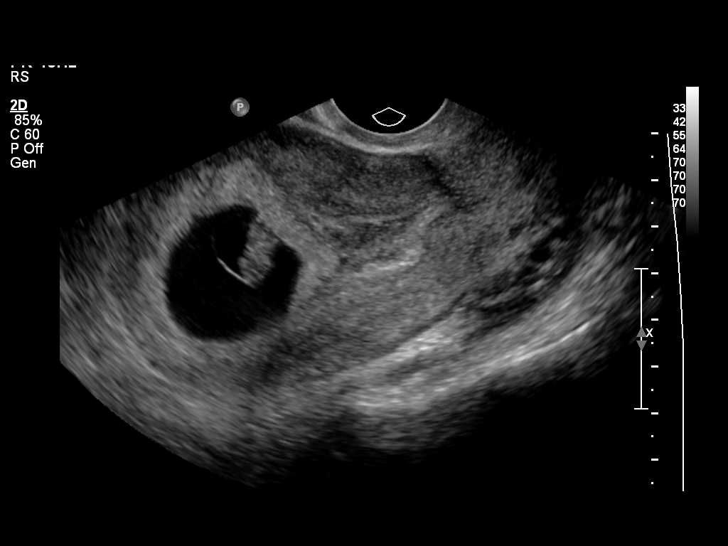

[14 of 28 positions shown; findings below may reference images not displayed]

FINDINGS: Intrauterine gestational sac: Visualized/normal in shape.

Yolk sac:  Visualized.

Embryo:  Visualized.

Cardiac Activity: Visualized.

Heart Rate:  154 bpm

CRL:   14.8  mm   7 w 6 d                  US EDC: July 10, 2014.

Maternal uterus/adnexae: Probable corpus luteum cyst seen in right
ovary. Left ovary appears normal. No hemorrhage is noted.
IMPRESSION: Single live intrauterine gestation of 7 weeks 6 days.

## 2015-02-16 IMAGING — US US OB TRANSVAGINAL
2 series · 14 of 28 positions shown · non-contrast
Comparison: 11/27/2013

CLINICAL DATA: Bleeding status post cerclage.

EXAM:
OBSTETRIC <14 WK US AND TRANSVAGINAL OB US
TECHNIQUE: Both transabdominal and transvaginal ultrasound examinations were
performed for complete evaluation of the gestation as well as the
maternal uterus, adnexal regions, and pelvic cul-de-sac.
Transvaginal technique was performed to assess early pregnancy.

[Series 1: us ob transvaginal · 0.24mm/px · 13 of 44 slices shown (1 of 2)]
[im 2/44]
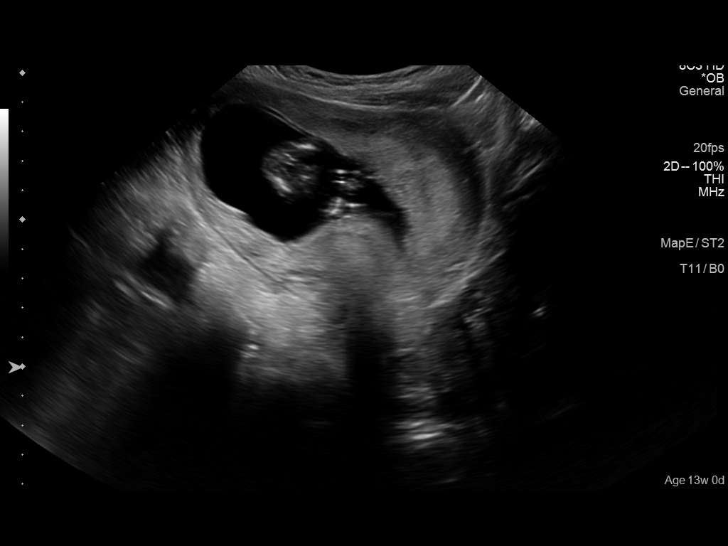
[im 5/44]
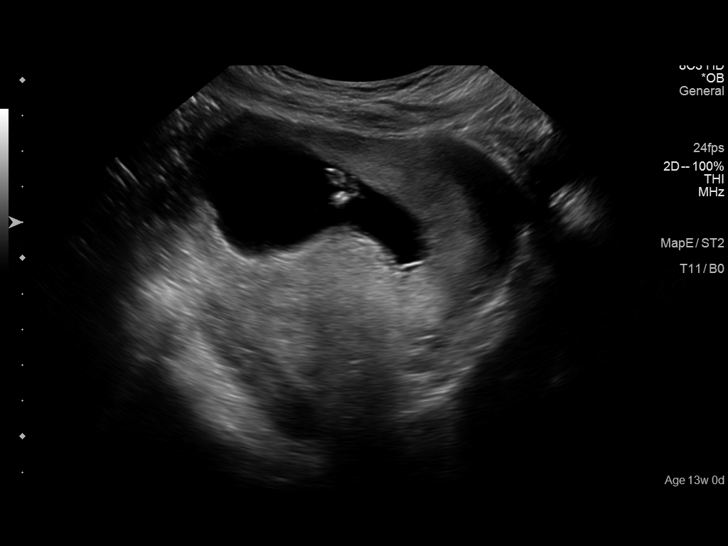
[im 9/44]
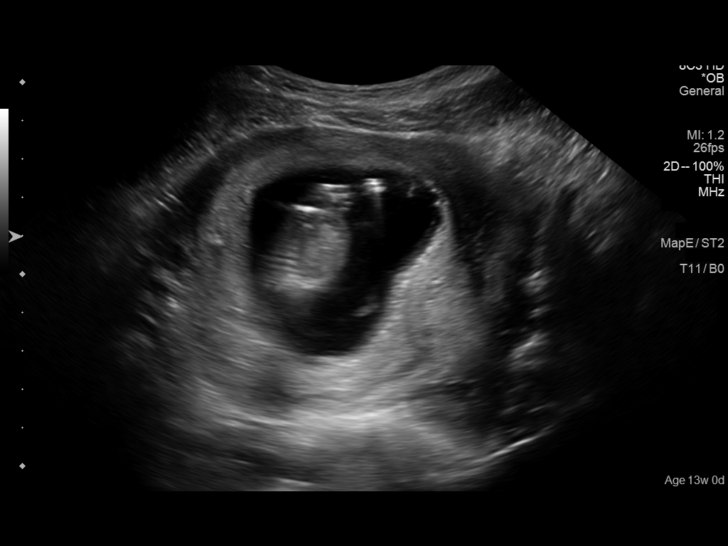
[im 12/44]
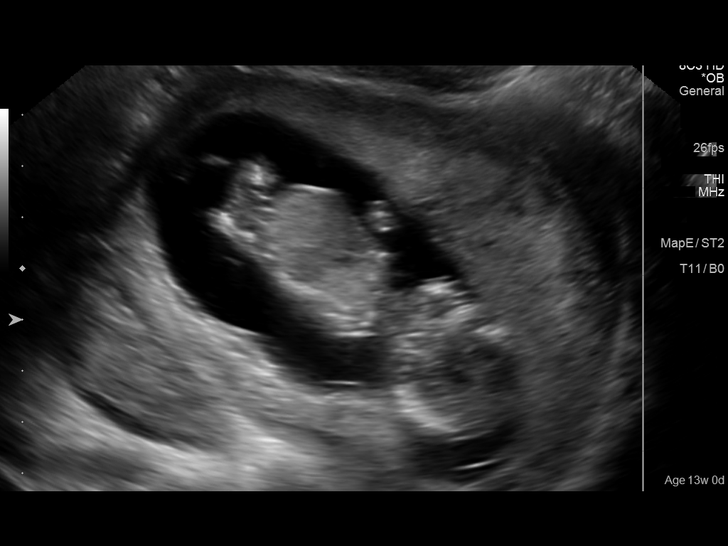
[im 15/44]
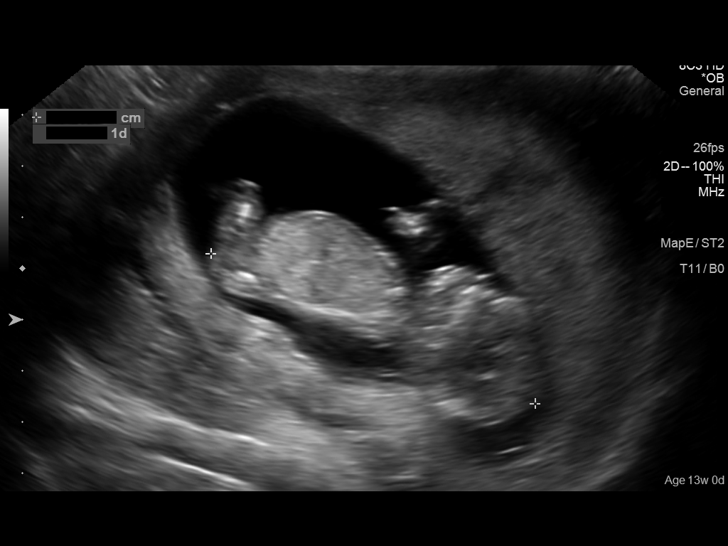
[im 19/44]
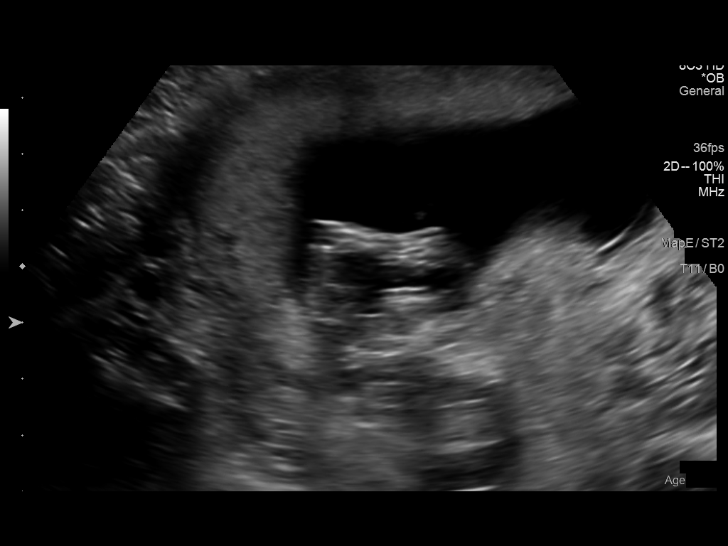
[im 22/44]
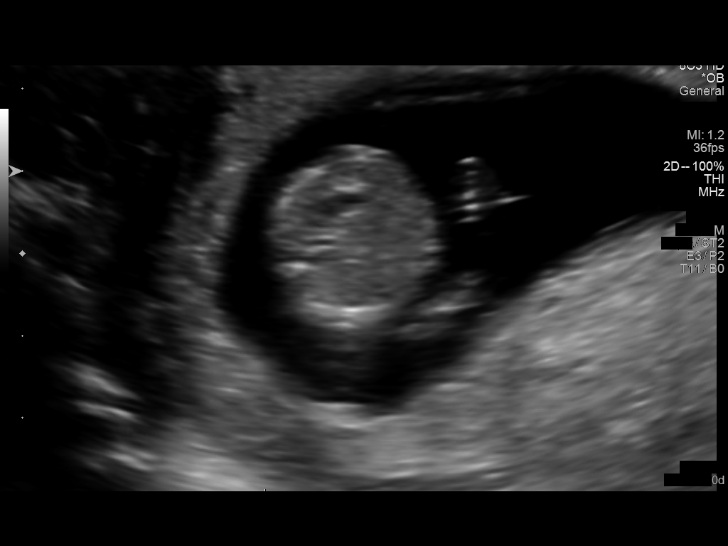
[im 25/44]
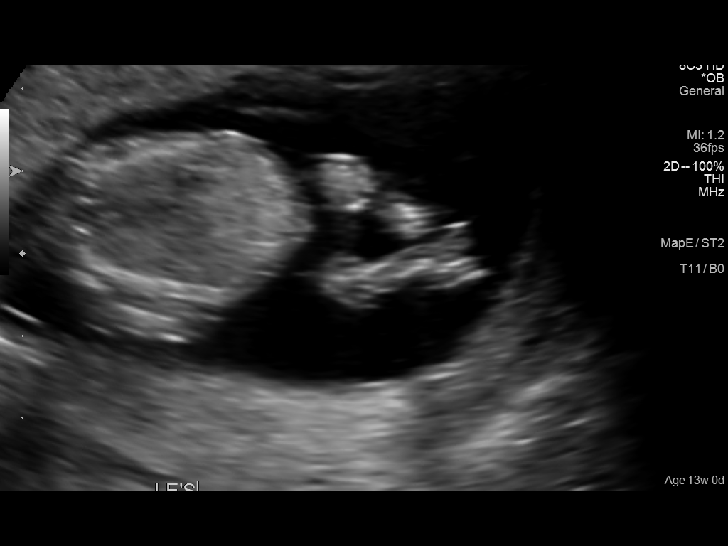
[im 29/44]
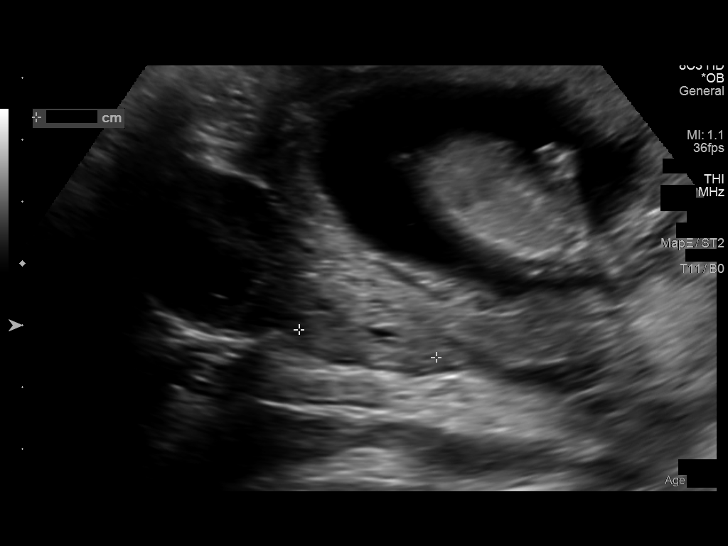
[im 32/44]
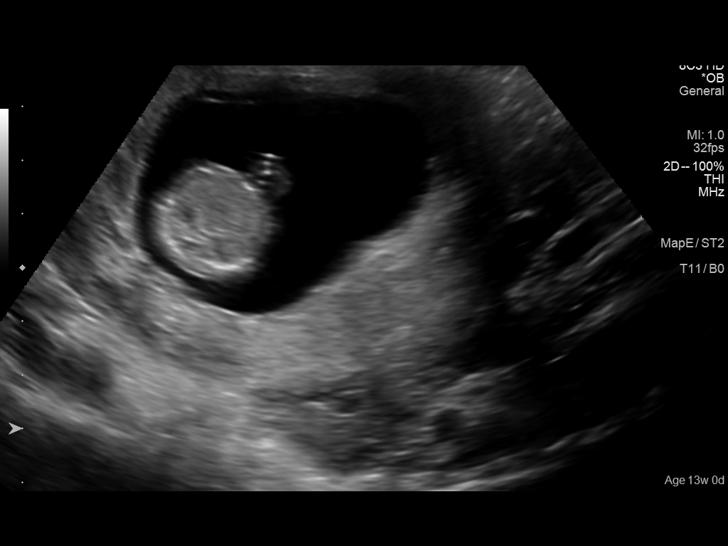
[im 35/44]
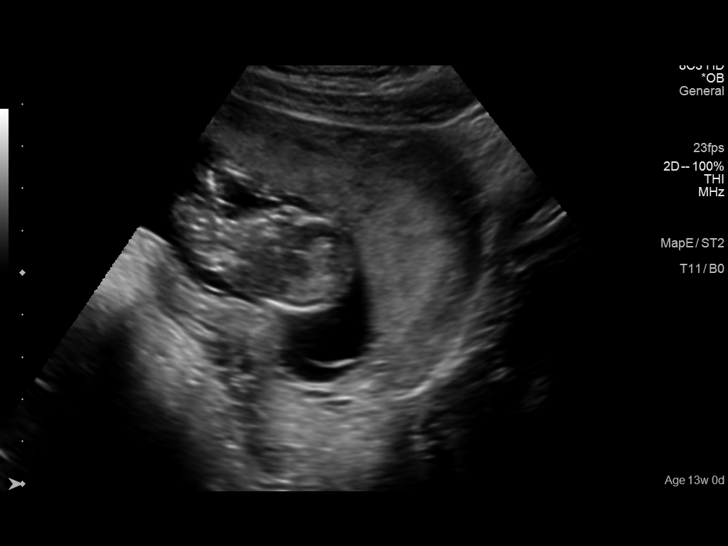
[im 39/44]
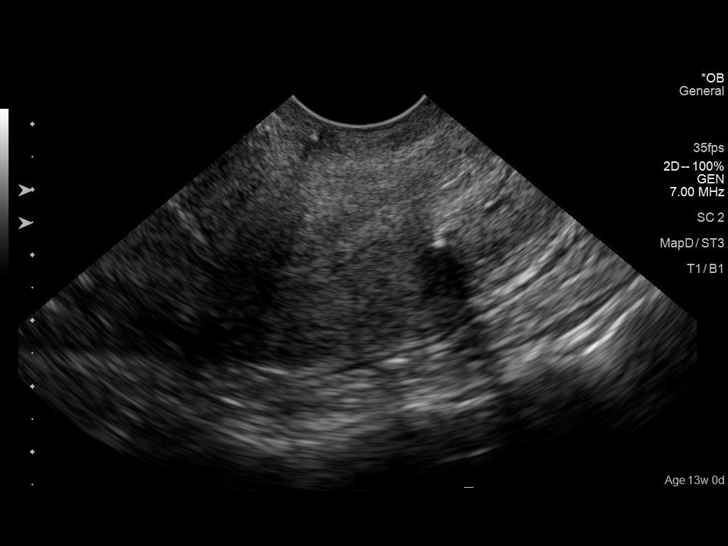
[im 42/44]
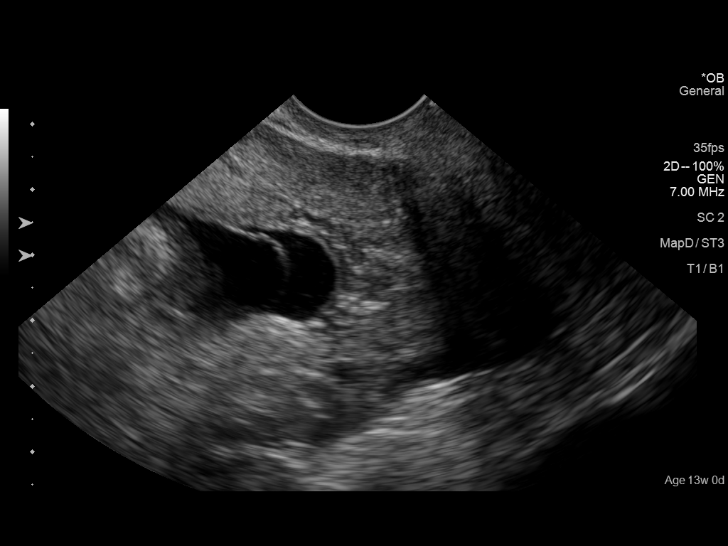

[Series 3: us ob transvaginal · 0.13mm/px · 1 of 1 slices shown (2 of 2)]
[im 1/1]
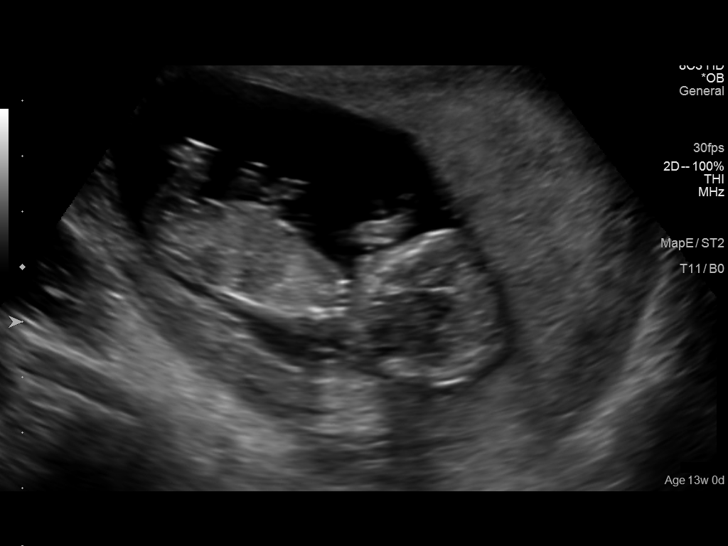

[14 of 28 positions shown; findings below may reference images not displayed]

FINDINGS: Intrauterine gestational sac: Visualized/normal in shape.

Yolk sac:  Not identified

Embryo:  Identified

Cardiac Activity: Identified

Heart Rate:  158 bpm

CRL:   70  mm   13 w 1d                  US EDC: 07/06/2014

Maternal uterus/adnexae: No subchorionic hemorrhage. Normal
sonographic appearance to the ovaries. No free fluid. The cervix
appears closed with cerclage visualized.
IMPRESSION: Intrauterine gestation with cardiac activity documented. Estimated
age of 13 weeks 1 day by crown-rump length.

Cervix appears closed.

## 2017-12-31 DEATH — deceased
